# Patient Record
Sex: Female | Born: 1955 | Race: White | Hispanic: No | Marital: Married | Smoking: Never smoker
Health system: Southern US, Community
[De-identification: ages and names within clinical notes are randomized; demographics above are authoritative.]

## PROBLEM LIST (undated history)

## (undated) DIAGNOSIS — C801 Malignant (primary) neoplasm, unspecified: Secondary | ICD-10-CM

## (undated) DIAGNOSIS — M1990 Unspecified osteoarthritis, unspecified site: Secondary | ICD-10-CM

## (undated) DIAGNOSIS — I10 Essential (primary) hypertension: Secondary | ICD-10-CM

## (undated) HISTORY — PX: TONSILLECTOMY: SUR1361

## (undated) HISTORY — PX: TUBAL LIGATION: SHX77

---

## 2001-12-18 ENCOUNTER — Encounter (INDEPENDENT_AMBULATORY_CARE_PROVIDER_SITE_OTHER): Payer: Self-pay

## 2001-12-18 ENCOUNTER — Ambulatory Visit (HOSPITAL_COMMUNITY): Admission: RE | Admit: 2001-12-18 | Discharge: 2001-12-18 | Payer: Self-pay | Admitting: Obstetrics and Gynecology

## 2002-12-24 ENCOUNTER — Emergency Department (HOSPITAL_COMMUNITY): Admission: EM | Admit: 2002-12-24 | Discharge: 2002-12-24 | Payer: Self-pay | Admitting: Emergency Medicine

## 2006-07-02 ENCOUNTER — Encounter: Admission: RE | Admit: 2006-07-02 | Discharge: 2006-07-02 | Payer: Self-pay | Admitting: Obstetrics and Gynecology

## 2010-03-06 ENCOUNTER — Encounter: Payer: Self-pay | Admitting: Obstetrics and Gynecology

## 2010-07-01 NOTE — Op Note (Signed)
   NAME:  Tracy Little, Tracy Little                    ACCOUNT NO.:  [id number]   MEDICAL RECORD NO.:  [id number]                   PATIENT TYPE:  AMB   LOCATION:  SDC                                  FACILITY:  [facility]   PHYSICIAN:  Lenoard Aden, M.D.             DATE OF BIRTH:  12/21/1955   DATE OF PROCEDURE:  12/18/2001  DATE OF DISCHARGE:                                 OPERATIVE REPORT   PREOPERATIVE DIAGNOSES:  1. Menometrorrhagia.  2. Endometrial mass.   POSTOPERATIVE DIAGNOSES:  Endometrial polyp.   PROCEDURE:  1. Diagnostic hysteroscopy.  2. Resectoscopic polypectomy.  3. Dilatation and curettage.   SURGEON:  Lenoard Aden, M.D.   ANESTHESIA:  General.   ESTIMATED BLOOD LOSS:  Less than 50 cc.   FLUID DEFICIT:  40 cc.   COMPLICATIONS:  None.   SPECIMENS:  Endometrial polyp, endometrial curettings.   DISPOSITION:  The patient to recovery in good condition.   BRIEF OPERATIVE NOTE:  After being apprised of the risks of anesthesia,  infection, bleeding, uterine perforation, possible need for repair, injury  to bowel and bladder with possible need for repair, the patient is brought  to the operating room where she is administered general anesthetic without  complications, prepped and draped in usual sterile fashion, catheterized  until the bladder is empty.  Feet are placed in the yellow thin stirrups.  After achieving adequate anesthesia examination reveals a retroflexed bulky  uterus.  No adnexal masses.  Cervix easily dilated up to a number 31 Pratt  dilator after placement of a dilute Pitressin solution 16 cc at 3 and 9  o'clock at the cervicovaginal junction.  Hysteroscope is placed easily.  Visualization reveals a right lateral fundally placed lower uterine body  polyp which projects into the mid portion of the cavity.  This is cauterized  and removed at the base using a double loop after multiple passes down to  the level of the endometrium.  Bilateral tubal  ostia are visualized.  D&C is performed using sharp endometrial curette.  Revisualization reveals  complete removal of the polyp.  No active bleeding.  Instruments are  removed.  Fluid deficit 40 cc noted.  The patient is awakened and  transferred to recovery room in good condition.                                               Lenoard Aden, M.D.    RJT/MEDQ  D:  12/18/2001  T:  12/18/2001  Job:  [id number]   cc:   [facility]

## 2010-07-01 NOTE — H&P (Signed)
   NAMEALONNA, Tracy Little                      ACCOUNT NO.:  [id number]   MEDICAL RECORD NO.:  [id number]                   PATIENT TYPE:   LOCATION:                                       FACILITY:  [facility]   PHYSICIAN:  Lenoard Aden, M.D.             DATE OF BIRTH:   DATE OF ADMISSION:  12/18/2001  DATE OF DISCHARGE:                                HISTORY & PHYSICAL   CHIEF COMPLAINT:  Menometrorrhagia with structural uterine mesh.   HISTORY OF THE PRESENT ILLNESS:  The patient is a 55 year old white female  G6, P3 who presents with irregular uterine bleeding.  Saline sonohystography  consistent with a 2 x 1 cm anterior wall mass consistent with fibroid versus  endometrial polyps.   PAST MEDICAL HISTORY:  1. Remarkable for tubal ligation in 1982.  2. Three vaginal deliveries.   No other medical or surgical hospitalizations.  One miscarriage and two  abortions.  Had spinal meningitis in 1990.   ALLERGIES:  No known drug allergies.   MEDICATIONS:  Takes no medications.   FAMILY HISTORY:  Remarkable for myocardial infarction and lymphoma.   PHYSICAL EXAMINATION:   GENERAL:  On physical exam she is a well-developed, well-nourished white  female in no apparent distress.   HEENT:  Normal.   LUNGS:  Clear.   HEART:  Regular rhythm.   ABDOMEN:  Soft and nontender.   PELVIC:  Uterus is bulky and anteflexed, and no adnexal masses are  appreciated.   IMPRESSION:  Structural endometrial lesion, questionable fibroid versus  polyp, with irregular uterine bleeding.    PLAN:  Proceed with diagnostic hysteroscopy, resectoscope.  Risks of  anesthesia, infection, bleeding and injury to intra-abdominal organs and  need for repair were discussed at length.  Risks and immediate complications  to include bowel or bladder injury were noted.  The patient acknowledges and  wishes to proceed.                                                 Lenoard Aden, M.D.    RJT/MEDQ   D:  12/17/2001  T:  12/17/2001  Job:  [id number]

## 2015-08-09 ENCOUNTER — Emergency Department (HOSPITAL_COMMUNITY): Payer: Managed Care, Other (non HMO)

## 2015-08-09 ENCOUNTER — Other Ambulatory Visit: Payer: Self-pay

## 2015-08-09 ENCOUNTER — Emergency Department (HOSPITAL_COMMUNITY)
Admission: EM | Admit: 2015-08-09 | Discharge: 2015-08-09 | Disposition: A | Discharge status: Home / Self Care | Payer: Managed Care, Other (non HMO) | Attending: Emergency Medicine | Admitting: Emergency Medicine

## 2015-08-09 ENCOUNTER — Encounter (HOSPITAL_COMMUNITY): Payer: Self-pay

## 2015-08-09 DIAGNOSIS — R1013 Epigastric pain: Secondary | ICD-10-CM | POA: Diagnosis not present

## 2015-08-09 DIAGNOSIS — Z79899 Other long term (current) drug therapy: Secondary | ICD-10-CM | POA: Diagnosis not present

## 2015-08-09 DIAGNOSIS — R0789 Other chest pain: Secondary | ICD-10-CM | POA: Diagnosis not present

## 2015-08-09 DIAGNOSIS — R079 Chest pain, unspecified: Secondary | ICD-10-CM | POA: Diagnosis present

## 2015-08-09 LAB — BASIC METABOLIC PANEL
Anion gap: 7 (ref 5–15)
BUN: 20 mg/dL (ref 6–20)
CO2: 26 mmol/L (ref 22–32)
Calcium: 9.4 mg/dL (ref 8.9–10.3)
Chloride: 108 mmol/L (ref 101–111)
Creatinine, Ser: 1.13 mg/dL — ABNORMAL HIGH (ref 0.44–1.00)
GFR calc Af Amer: >60 mL/min (ref >60)
GFR calc non Af Amer: 52 mL/min — ABNORMAL LOW (ref >60)
Glucose, Bld: 147 mg/dL — ABNORMAL HIGH (ref 65–99)
Potassium: 3.7 mmol/L (ref 3.5–5.1)
Sodium: 141 mmol/L (ref 135–145)

## 2015-08-09 LAB — CBC
HCT: 43.7 % (ref 36.0–46.0)
Hemoglobin: 14.2 g/dL (ref 12.0–15.0)
MCH: 28 pg (ref 26.0–34.0)
MCHC: 32.5 g/dL (ref 30.0–36.0)
MCV: 86 fL (ref 78.0–100.0)
Platelets: 261 10*3/uL (ref 150–400)
RBC: 5.08 MIL/uL (ref 3.87–5.11)
RDW: 14.4 % (ref 11.5–15.5)
WBC: 9 10*3/uL (ref 4.0–10.5)

## 2015-08-09 LAB — HEPATIC FUNCTION PANEL
ALT: 113 U/L — ABNORMAL HIGH (ref 14–54)
AST: 166 U/L — ABNORMAL HIGH (ref 15–41)
Albumin: 3.6 g/dL (ref 3.5–5.0)
Alkaline Phosphatase: 117 U/L (ref 38–126)
Bilirubin, Direct: 0.4 mg/dL (ref 0.1–0.5)
Indirect Bilirubin: 0.5 mg/dL (ref 0.3–0.9)
Total Bilirubin: 0.9 mg/dL (ref 0.3–1.2)
Total Protein: 6.9 g/dL (ref 6.5–8.1)

## 2015-08-09 LAB — I-STAT TROPONIN, ED
Troponin i, poc: 0 ng/mL (ref 0.00–0.08)
Troponin i, poc: 0 ng/mL (ref 0.00–0.08)

## 2015-08-09 MED ORDER — ONDANSETRON 4 MG PO TBDP
4.0000 mg | ORAL_TABLET | Freq: Once | ORAL | Status: AC | PRN
  Administered 2015-08-09: 4 mg via ORAL

## 2015-08-09 MED ORDER — OMEPRAZOLE 20 MG PO CPDR: 20.0000 mg | DELAYED_RELEASE_CAPSULE | Freq: Every day | ORAL | Status: DC

## 2015-08-09 MED ORDER — ONDANSETRON 4 MG PO TBDP
ORAL_TABLET | ORAL | Status: AC
  Filled 2015-08-09: qty 1

## 2015-08-09 MED ORDER — OXYCODONE-ACETAMINOPHEN 5-325 MG PO TABS
ORAL_TABLET | ORAL | Status: AC
  Filled 2015-08-09: qty 1

## 2015-08-09 MED ORDER — OXYCODONE-ACETAMINOPHEN 5-325 MG PO TABS
1.0000 | ORAL_TABLET | Freq: Once | ORAL | Status: AC
  Administered 2015-08-09: 1 via ORAL

## 2015-08-09 NOTE — ED Notes (Signed)
Per PT, Pt was coming from work today with complaints of left sided chest pressure radiating to the back. Pt reports radiating to the back with nausea, diaphoresis, and SOB. Pt has no Hx of the same

## 2015-08-09 NOTE — ED Provider Notes (Signed)
Complaint of anterior chest pain left-sided radiating to back onset yesterday approximate 1.5 hours after eating. Had similar episode today. She is presently asymptomatic. Associated symptoms include nausea shortness of breath and sweatiness. Patient has had similar episodes in the past has never sought medical care. She regularly exerts herself doing spin classes without any chest pain. Pain was onset approximate hour after eating yesterday and today. On exam alert no distress lungs clear auscultation heart regular rate and rhythm abdomen nondistended nontender extremities with trace pretibial edema. Heart score equals 3. Patient is amenable to outpatient cardiac workup  Orlie Dakin, MD 08/10/15 1724

## 2015-08-09 NOTE — Discharge Instructions (Signed)
There does not appear to be an emergent cause for your symptoms at this time. Your exam, labs, chest x-ray were all reassuring. Your discomfort may be due to acid reflux. Please take your medication as prescribed as this may help with your discomfort. Follow-up with cardiology for reevaluation. You may also follow-up with your primary care doctor for a possible ultrasound of your right upper quadrant to evaluate for gallbladder disease. Return to ED for new or worsening symptoms.

## 2015-08-09 NOTE — ED Provider Notes (Signed)
CSN: [id number]     Arrival date & time 08/09/15  1324 History   First MD Initiated Contact with Patient 08/09/15 1419     Chief Complaint  Patient presents with  . Chest Pain     (Consider location/radiation/quality/duration/timing/severity/associated sxs/prior Treatment) HPI Tracy Little is a 59 y.o. female who comes in for evaluation of chest pain. Patient reports intermittent chest pain over the past 1 month, worse over the past 2 days after eating bacon sandwiches. She reports sharp pain in her left chest that radiated to her back. Most recently started at 10:00 AM after eating and has slowly resolved. Still feels mild discomfort now. She reports associated nausea, 2 episodes of emesis, diaphoresis and shortness of breath. No abdominal pain, fevers or chills, urinary or bowel symptoms. She denies any exertional component and states that she has been exercising more and does not experience this discomfort. No recent travel, surgeries, hemoptysis, OCP use.  History reviewed. No pertinent past medical history. Past Surgical History  Procedure Laterality Date  . Tubal ligation    . Tonsillectomy     No family history on file. Social History  Substance Use Topics  . Smoking status: Never Smoker   . Smokeless tobacco: Never Used  . Alcohol Use: No   OB History    No data available     Review of Systems A 10 point review of systems was completed and was negative except for pertinent positives and negatives as mentioned in the history of present illness     Allergies  Review of patient's allergies indicates no known allergies.  Home Medications   Prior to Admission medications   Medication Sig Start Date End Date Taking? Authorizing Provider  Multiple Vitamin (MULTIVITAMIN WITH MINERALS) TABS tablet Take 1 tablet by mouth daily.   Yes Historical Provider, MD  omeprazole (PRILOSEC) 20 MG capsule Take 1 capsule (20 mg total) by mouth daily. 08/09/15   Comer Locket,  PA-C   BP 128/79 mmHg  Pulse 73  Temp(Src) 97.6 F (36.4 C) (Oral)  Resp 16  Ht 5\' 8"  (1.727 m)  Wt 92.987 kg  BMI 31.18 kg/m2  SpO2 96%  LMP 08/09/2014 (Approximate) Physical Exam  Constitutional: She is oriented to person, place, and time. She appears well-developed and well-nourished.  Obese white female. No apparent distress  HENT:  Head: Normocephalic and atraumatic.  Mouth/Throat: Oropharynx is clear and moist.  Eyes: Conjunctivae are normal. Pupils are equal, round, and reactive to light. Right eye exhibits no discharge. Left eye exhibits no discharge. No scleral icterus.  Neck: Neck supple.  Cardiovascular: Normal rate, regular rhythm and normal heart sounds.   Pulmonary/Chest: Effort normal and breath sounds normal. No respiratory distress. She has no wheezes. She has no rales.  Abdominal: Soft. There is tenderness.  Discomfort is replicated with palpation of epigastrium. Otherwise nontender, nondistended without rebound or guarding. No pulsatile mass.  Musculoskeletal: Normal range of motion. She exhibits no edema or tenderness.  Neurological: She is alert and oriented to person, place, and time.  Cranial Nerves II-XII grossly intact  Skin: Skin is warm and dry. No rash noted.  Psychiatric: She has a normal mood and affect.  Nursing note and vitals reviewed.   ED Course  Procedures (including critical care time) Labs Review Labs Reviewed  BASIC METABOLIC PANEL - Abnormal; Notable for the following:    Glucose, Bld 147 (*)    Creatinine, Ser 1.13 (*)    GFR calc non Af Amer 52 (*)  All other components within normal limits  HEPATIC FUNCTION PANEL - Abnormal; Notable for the following:    AST 166 (*)    ALT 113 (*)    All other components within normal limits  CBC  I-STAT TROPOININ, ED  Randolm Idol, ED    Imaging Review Dg Chest 2 View  08/09/2015  CLINICAL DATA:  Shortness of breath and chest pain for 2 days EXAM: CHEST  2 VIEW COMPARISON:  None.  FINDINGS: Lungs are clear. Heart size and pulmonary vascularity are normal. No adenopathy. No pneumothorax. No bone lesions. IMPRESSION: No edema or consolidation. Electronically Signed   By: Lowella Grip III M.D.   On: 08/09/2015 14:27   I have personally reviewed and evaluated these images and lab results as part of my medical decision-making.   EKG Interpretation   Date/Time:  Monday August 09 2015 13:35:45 EDT Ventricular Rate:  77 PR Interval:  116 QRS Duration: 82 QT Interval:  380 QTC Calculation: 430 R Axis:   79 Text Interpretation:  Normal sinus rhythm Lateral infarct , age  undetermined Abnormal ECG No old tracing to compare Confirmed by  JACUBOWITZ  MD, SAM [phone]) on 08/09/2015 4:28:43 PM     Filed Vitals:   08/09/15 1730 08/09/15 1745 08/09/15 1800 08/09/15 1815  BP: 121/85 128/77 113/73 128/79  Pulse: 72 71 74 73  Temp:      TempSrc:      Resp: 14 13 15 16   Height:      Weight:      SpO2: 99% 99% 100% 96%    MDM  Patient presents for evaluation of left-sided chest pain radiates to her back, started at 10 AM after eating. Has Experienced in the past after eating. There is no exertional component. Discomfort is replicated with palpation of epigastrium. Heart score 3. Delta troponin negative. Patient is now asymptomatic in emergency department is that she feels much better. Discussed follow-up with cardiology for outpatient echo, which she agrees to. Screening labs are reassuring. She does have increased in AST 166 and ALT 113, she has no right upper quadrant tenderness or other concerning findings. Recommended she follow up with PCP for outpatient right upper quadrant ultrasound for further evaluation of possible biliary etiology. Symptoms overall sound GI in etiology, will initiate PPI. Discussed with Dr. Winfred Leeds, Dr Regenia Skeeter. Final diagnoses:  Chest discomfort  Epigastric discomfort        Comer Locket, PA-C 08/09/15 1850  Sherwood Gambler,  MD 08/10/15 [phone]

## 2015-08-09 NOTE — ED Notes (Signed)
Dr. Winfred Leeds at bedside with patient and family.

## 2015-08-26 ENCOUNTER — Ambulatory Visit: Payer: Managed Care, Other (non HMO) | Admitting: Cardiology

## 2020-10-12 DIAGNOSIS — M79643 Pain in unspecified hand: Secondary | ICD-10-CM | POA: Diagnosis not present

## 2020-10-12 DIAGNOSIS — Z6832 Body mass index (BMI) 32.0-32.9, adult: Secondary | ICD-10-CM | POA: Diagnosis not present

## 2020-11-01 DIAGNOSIS — M79641 Pain in right hand: Secondary | ICD-10-CM | POA: Diagnosis not present

## 2020-11-13 ENCOUNTER — Encounter (HOSPITAL_COMMUNITY): Payer: Self-pay | Admitting: *Deleted

## 2020-11-13 ENCOUNTER — Emergency Department (HOSPITAL_COMMUNITY): Payer: Self-pay

## 2020-11-13 ENCOUNTER — Other Ambulatory Visit: Payer: Self-pay

## 2020-11-13 ENCOUNTER — Emergency Department (HOSPITAL_COMMUNITY)
Admission: EM | Admit: 2020-11-13 | Discharge: 2020-11-14 | Disposition: A | Discharge status: Home / Self Care | Payer: Self-pay | Attending: Emergency Medicine | Admitting: Emergency Medicine

## 2020-11-13 DIAGNOSIS — K8020 Calculus of gallbladder without cholecystitis without obstruction: Secondary | ICD-10-CM | POA: Diagnosis not present

## 2020-11-13 DIAGNOSIS — Z79899 Other long term (current) drug therapy: Secondary | ICD-10-CM | POA: Insufficient documentation

## 2020-11-13 DIAGNOSIS — R0789 Other chest pain: Secondary | ICD-10-CM | POA: Diagnosis not present

## 2020-11-13 LAB — CBC
HCT: 44.8 % (ref 36.0–46.0)
Hemoglobin: 13.9 g/dL (ref 12.0–15.0)
MCH: 28.7 pg (ref 26.0–34.0)
MCHC: 31 g/dL (ref 30.0–36.0)
MCV: 92.4 fL (ref 80.0–100.0)
Platelets: 268 10*3/uL (ref 150–400)
RBC: 4.85 MIL/uL (ref 3.87–5.11)
RDW: 14.7 % (ref 11.5–15.5)
WBC: 9 10*3/uL (ref 4.0–10.5)
nRBC: 0 % (ref 0.0–0.2)

## 2020-11-13 LAB — BASIC METABOLIC PANEL
Anion gap: 9 (ref 5–15)
BUN: 24 mg/dL — ABNORMAL HIGH (ref 8–23)
CO2: 26 mmol/L (ref 22–32)
Calcium: 9.2 mg/dL (ref 8.9–10.3)
Chloride: 105 mmol/L (ref 98–111)
Creatinine, Ser: 0.93 mg/dL (ref 0.44–1.00)
GFR, Estimated: >60 mL/min (ref >60)
Glucose, Bld: 109 mg/dL — ABNORMAL HIGH (ref 70–99)
Potassium: 3.6 mmol/L (ref 3.5–5.1)
Sodium: 140 mmol/L (ref 135–145)

## 2020-11-13 LAB — LIPASE, BLOOD: Lipase: 40 U/L (ref 11–51)

## 2020-11-13 LAB — TROPONIN I (HIGH SENSITIVITY): Troponin I (High Sensitivity): 4 ng/L (ref <18)

## 2020-11-13 MED ORDER — ONDANSETRON 4 MG PO TBDP
8.0000 mg | ORAL_TABLET | Freq: Once | ORAL | Status: AC
  Administered 2020-11-13: 8 mg via ORAL
  Filled 2020-11-13: qty 2

## 2020-11-13 NOTE — ED Triage Notes (Signed)
The pt is c/o chest pain with nausea and vomiting for 3 days  she has not taken any med for the pain due to nausea and vomiting

## 2020-11-14 ENCOUNTER — Encounter (HOSPITAL_COMMUNITY): Payer: Self-pay | Admitting: Emergency Medicine

## 2020-11-14 ENCOUNTER — Emergency Department (HOSPITAL_COMMUNITY): Payer: Self-pay

## 2020-11-14 LAB — HEPATIC FUNCTION PANEL
ALT: 20 U/L (ref 0–44)
AST: 27 U/L (ref 15–41)
Albumin: 3.5 g/dL (ref 3.5–5.0)
Alkaline Phosphatase: 64 U/L (ref 38–126)
Bilirubin, Direct: 0.2 mg/dL (ref 0.0–0.2)
Indirect Bilirubin: 0.4 mg/dL (ref 0.3–0.9)
Total Bilirubin: 0.6 mg/dL (ref 0.3–1.2)
Total Protein: 6.7 g/dL (ref 6.5–8.1)

## 2020-11-14 LAB — TROPONIN I (HIGH SENSITIVITY): Troponin I (High Sensitivity): 4 ng/L (ref <18)

## 2020-11-14 NOTE — ED Notes (Signed)
Patient transported to Ultrasound 

## 2020-11-14 NOTE — ED Provider Notes (Signed)
[facility] EMERGENCY DEPARTMENT Provider Note   CSN: [id number] Arrival date & time: 11/13/20  2027     History Chief Complaint  Patient presents with   Chest Pain    Tracy Little is a 65 y.o. female.  No notable past medical history.  Patient presents to the emergency department with complaints of nausea and vomiting and chest pain over the past 3 days.  Patient states that this started 3 days ago after dinner.  She experienced right upper quadrant and epigastric abdominal pain that radiated up to her shoulders into her back.  She said she had associated nausea and vomiting.  She states that this episode lasted about 2 hours and improved on its own.  She had similar episodes the following 2 nights after eating high-fat foods. Last night it was the worst where it lasted 5 to 6 hours.  She describes the pain as feeling like she was going to die.  She took Aleve which helped with the pain.  Pain feels like pressure.  She denies any fevers, shortness of breath, vision changes, diarrhea, constipation, leg swelling, melena, hemoptysis, or hematemesis.   Chest Pain Associated symptoms: abdominal pain, nausea and vomiting   Associated symptoms: no back pain, no cough, no dizziness, no fever, no headache, no palpitations, no shortness of breath and no weakness       History reviewed. No pertinent past medical history.  There are no problems to display for this patient.   Past Surgical History:  Procedure Laterality Date   TONSILLECTOMY     TUBAL LIGATION       OB History   No obstetric history on file.     History reviewed. No pertinent family history.  Social History   Tobacco Use   Smoking status: Never   Smokeless tobacco: Never  Substance Use Topics   Alcohol use: No   Drug use: No    Home Medications Prior to Admission medications   Medication Sig Start Date End Date Taking? Authorizing Provider  Biotin 2500 MCG CAPS Take 2,500 mcg by  mouth daily.   Yes [provider]  chlorthalidone (HYGROTON) 25 MG tablet Take 25 mg by mouth daily. 10/31/20  Yes [provider]  metoprolol succinate (TOPROL-XL) 50 MG 24 hr tablet Take 25 mg by mouth daily.   Yes [provider]  Multiple Vitamin (MULTIVITAMIN WITH MINERALS) TABS tablet Take 1 tablet by mouth daily.   Yes [provider]  naproxen sodium (ALEVE) 220 MG tablet Take 440 mg by mouth daily as needed (pain).   Yes [provider]  predniSONE (DELTASONE) 10 MG tablet Take 10 mg by mouth. 11/01/20  Yes [provider]  omeprazole (PRILOSEC) 20 MG capsule Take 1 capsule (20 mg total) by mouth daily. Patient not taking: Reported on 11/14/2020 08/09/15   Comer Locket, PA-C    Allergies    Patient has no known allergies.  Review of Systems   Review of Systems  Constitutional:  Negative for chills and fever.  HENT:  Negative for congestion, rhinorrhea and sore throat.   Eyes:  Negative for visual disturbance.  Respiratory:  Negative for cough, chest tightness and shortness of breath.   Cardiovascular:  Positive for chest pain. Negative for palpitations and leg swelling.  Gastrointestinal:  Positive for abdominal pain, nausea and vomiting. Negative for blood in stool, constipation and diarrhea.  Genitourinary:  Negative for dysuria, flank pain and hematuria.  Musculoskeletal:  Negative for back pain.  Skin:  Negative for rash and wound.  Neurological:  Negative for dizziness, syncope, weakness, light-headedness and headaches.  Psychiatric/Behavioral:  Negative for confusion.   All other systems reviewed and are negative.  Physical Exam Updated Vital Signs BP 119/81   Pulse 79   Temp 98.1 F (36.7 C) (Oral)   Resp 17   Ht 5\' 8"  (1.727 m)   Wt 93 kg   LMP 08/09/2014 (Approximate)   SpO2 97%   BMI 31.17 kg/m   Physical Exam Vitals and nursing note reviewed.  Constitutional:      General: She is not in acute  distress.    Appearance: Normal appearance. She is not ill-appearing, toxic-appearing or diaphoretic.  HENT:     Head: Normocephalic and atraumatic.     Nose: No nasal deformity.     Mouth/Throat:     Lips: Pink. No lesions.     Mouth: Mucous membranes are dry. No injury, lacerations, oral lesions or angioedema.     Pharynx: Oropharynx is clear. Uvula midline. No pharyngeal swelling, oropharyngeal exudate, posterior oropharyngeal erythema or uvula swelling.     Comments: Geographic tongue Eyes:     General: Gaze aligned appropriately. No scleral icterus.       Right eye: No discharge.        Left eye: No discharge.     Conjunctiva/sclera: Conjunctivae normal.     Right eye: Right conjunctiva is not injected. No exudate or hemorrhage.    Left eye: Left conjunctiva is not injected. No exudate or hemorrhage. Cardiovascular:     Rate and Rhythm: Normal rate and regular rhythm.     Pulses: Normal pulses.          Radial pulses are 2+ on the right side and 2+ on the left side.       Dorsalis pedis pulses are 2+ on the right side and 2+ on the left side.     Heart sounds: Normal heart sounds, S1 normal and S2 normal. Heart sounds not distant. No murmur heard.   No friction rub. No gallop. No S3 or S4 sounds.  Pulmonary:     Effort: Pulmonary effort is normal. No accessory muscle usage or respiratory distress.     Breath sounds: Normal breath sounds. No stridor. No wheezing, rhonchi or rales.  Chest:     Chest wall: No tenderness.  Abdominal:     General: Abdomen is flat. Bowel sounds are normal. There is no distension.     Palpations: Abdomen is soft. There is no mass or pulsatile mass.     Tenderness: There is abdominal tenderness in the right upper quadrant and epigastric area. There is no guarding or rebound.  Musculoskeletal:     Right lower leg: No edema.     Left lower leg: No edema.  Skin:    General: Skin is warm and dry.     Coloration: Skin is not jaundiced or pale.      Findings: No bruising, erythema, lesion or rash.  Neurological:     General: No focal deficit present.     Mental Status: She is alert and oriented to person, place, and time.     GCS: GCS eye subscore is 4. GCS verbal subscore is 5. GCS motor subscore is 6.  Psychiatric:        Mood and Affect: Mood normal.        Behavior: Behavior normal. Behavior is cooperative.    ED Results / Procedures / Treatments  Labs (all labs ordered are listed, but only abnormal results are displayed) Labs Reviewed  BASIC METABOLIC PANEL - Abnormal; Notable for the following components:      Result Value   Glucose, Bld 109 (*)    BUN 24 (*)    All other components within normal limits  CBC  LIPASE, BLOOD  HEPATIC FUNCTION PANEL  TROPONIN I (HIGH SENSITIVITY)  TROPONIN I (HIGH SENSITIVITY)    EKG EKG Interpretation  Date/Time:  Saturday November 13 2020 20:35:32 EDT Ventricular Rate:  68 PR Interval:  166 QRS Duration: 82 QT Interval:  402 QTC Calculation: 427 R Axis:   88 Text Interpretation: Normal sinus rhythm Cannot rule out Anterior infarct , age undetermined Abnormal ECG no STEMI. no acute ischemic changes compared to previous tracing Confirmed by Charlesetta Shanks [phone]) on 11/14/2020 7:34:14 AM  Radiology DG Chest 2 View  Result Date: 11/13/2020 CLINICAL DATA:  Chest pain with nausea and vomiting for several days EXAM: CHEST - 2 VIEW COMPARISON:  08/09/2015 FINDINGS: The heart size and mediastinal contours are within normal limits. Both lungs are clear. Minimal scarring is noted in the left base. The visualized skeletal structures are unremarkable. IMPRESSION: No active cardiopulmonary disease. Electronically Signed   By: Inez Catalina M.D.   On: 11/13/2020 21:29   US Abdomen Complete  Result Date: 11/14/2020 CLINICAL DATA:  look at gallbladder, liver, appendix, aorta EXAM: ABDOMEN ULTRASOUND COMPLETE COMPARISON:  None. FINDINGS: Gallbladder: Cholelithiasis. The gallbladder is slightly  distended measuring up to 9.5 x 4.7 cm. Borderline wall thickening measuring 3-4 mm. No pericholecystic fluid. Negative sonographic Murphy's sign. Common bile duct: Diameter: 0.5 cm Liver: No focal lesion identified. Within normal limits in parenchymal echogenicity. Portal vein is patent on color Doppler imaging with normal direction of blood flow towards the liver. IVC: Not well visualized. Pancreas: Visualized portion unremarkable. Spleen: Size and appearance within normal limits. Right Kidney: Length: 10.1 cm. Echogenicity within normal limits. No mass or hydronephrosis visualized. Left Kidney: Length: 10.7 cm. There is an ill-defined mass in the midpole measuring up to 2.4 x 1.8 x 1.7 cm. Abdominal aorta: No aneurysm visualized. Other findings: None. IMPRESSION: 1. Cholelithiasis with distended gallbladder and borderline wall thickening. No pericholecystic fluid, negative sonographic Murphy's sign. Overall, these findings are equivocal for acute cholecystitis. If persistent clinical concern, recommend HIDA scan for further evaluation. 2. There is an ill-defined mass in the left kidney midpole measuring up to 2.4 cm. Further evaluation with either CT or MRI renal mass protocol is recommended. 3. The appendix is not visualized on this exam. Electronically Signed   By: Albin Felling M.D.   On: 11/14/2020 10:17    Procedures Procedures   Medications Ordered in ED Medications  ondansetron (ZOFRAN-ODT) disintegrating tablet 8 mg (8 mg Oral Given 11/13/20 2100)    ED Course  I have reviewed the triage vital signs and the nursing notes.  Pertinent labs & imaging results that were available during my care of the patient were reviewed by me and considered in my medical decision making (see chart for details).  Clinical Course as of 11/14/20 1551  Sun Nov 14, 2020  1328 Spoke with General Surgery, Rosendo Gros regarding cholelithiasis. They will schedule her an appointment this week and see her in the office  [GL]    Clinical Course User Index [GL] Deaysia Grigoryan, Adora Fridge, PA-C   MDM Rules/Calculators/A&P  This is a well-appearing 65 year old female presents the emergency department with epigastric abdominal pain that is present mostly after eating fatty foods.  Her vitals on arrival are normal.  She is afebrile. Patient in no acute distress. She is nontoxic. Walking around room. No active abdominal pain.  I suspect that patient has a gallbladder etiology to her symptoms. Obtaining abdominal ultrasound to r/o gallbladder disease.  I reviewed All labs and imaging. She has no leukocytosis.  Her LFTs are not elevated.  She has no bilirubin elevation.  Her lipase is normal.  Her troponin is normal.  Other labs are insignificant. Chest x-ray with no acute cardiopulmonary disease. EKG with no change from prior. Ultrasound shows cholelithiasis with distended gallbladder and borderline wall thickening.  This shows an ill-defined mass in the left kidney midpole measuring up to 2.4 cm.  Further evaluation with CT or MRI is recommended outpatient.  Given patient's presentation and lack of systemic symptoms including fever, jaundice, leukocytosis, continued pain I do not Hise high suspicion for cholecystitis at this time.  She does have cholelithiasis and needs to follow-up with general surgery.  1328: I spoke with general surgery Dr. Rosendo Gros regarding cholelithiasis.  They agree the patient does not meet inpatient requirements.  They will see her in the office later this week.  Patient is given contact instructions.  Patient given discharge instructions and she feels comfortable with the plan going forward.  Told to return to the emergency department if she develops worsening symptoms.  Patient needs to follow-up with her PCP regarding the left kidney mass found incidentally.  She will need outpatient CT or MRI to further characterize this.  Final Clinical Impression(s) / ED Diagnoses Final  diagnoses:  Calculus of gallbladder without cholecystitis without obstruction    Rx / DC Orders ED Discharge Orders          Ordered    Ambulatory referral to General Surgery        11/14/20 1347             Sheila Oats 11/14/20 1551    Charlesetta Shanks, MD 11/29/20 [phone]

## 2020-11-14 NOTE — Discharge Instructions (Addendum)
You have been seen in the emergency department for epigastric abdominal pain.  We found gallstones in your gallbladder.  You need to call Dr. Rosendo Gros from general surgery tomorrow morning to schedule an appointment for later this week.  Please follow a low-fat diet with limitation of spicy foods.  I provided a informational pamphlet in your discharge instructions for further information.  You develop symptoms such as fever, chills, ongoing symptoms that do not go away on their own, yellow tinted skin please return to the emergency department.

## 2020-11-29 DIAGNOSIS — N2889 Other specified disorders of kidney and ureter: Secondary | ICD-10-CM | POA: Diagnosis not present

## 2020-11-29 DIAGNOSIS — K8020 Calculus of gallbladder without cholecystitis without obstruction: Secondary | ICD-10-CM | POA: Diagnosis not present

## 2020-12-06 ENCOUNTER — Other Ambulatory Visit: Payer: Self-pay | Admitting: Surgery

## 2020-12-06 DIAGNOSIS — N2889 Other specified disorders of kidney and ureter: Secondary | ICD-10-CM

## 2020-12-07 ENCOUNTER — Ambulatory Visit
Admission: RE | Admit: 2020-12-07 | Discharge: 2020-12-07 | Disposition: A | Discharge status: Home / Self Care | Payer: BC Managed Care – PPO | Source: Ambulatory Visit | Attending: Surgery | Admitting: Surgery

## 2020-12-07 ENCOUNTER — Other Ambulatory Visit: Payer: Self-pay

## 2020-12-07 DIAGNOSIS — K8080 Other cholelithiasis without obstruction: Secondary | ICD-10-CM | POA: Diagnosis not present

## 2020-12-07 DIAGNOSIS — N2889 Other specified disorders of kidney and ureter: Secondary | ICD-10-CM

## 2020-12-07 MED ORDER — GADOBENATE DIMEGLUMINE 529 MG/ML IV SOLN
19.0000 mL | Freq: Once | INTRAVENOUS | Status: AC | PRN
  Administered 2020-12-07: 19 mL via INTRAVENOUS

## 2020-12-14 ENCOUNTER — Ambulatory Visit: Payer: Self-pay | Admitting: Surgery

## 2020-12-14 DIAGNOSIS — N3020 Other chronic cystitis without hematuria: Secondary | ICD-10-CM | POA: Diagnosis not present

## 2020-12-14 DIAGNOSIS — R3121 Asymptomatic microscopic hematuria: Secondary | ICD-10-CM | POA: Diagnosis not present

## 2020-12-14 DIAGNOSIS — K8050 Calculus of bile duct without cholangitis or cholecystitis without obstruction: Secondary | ICD-10-CM | POA: Diagnosis not present

## 2020-12-15 NOTE — Patient Instructions (Signed)
DUE TO COVID-19 ONLY ONE VISITOR IS ALLOWED TO COME WITH YOU AND STAY IN THE WAITING ROOM ONLY DURING PRE OP AND PROCEDURE.   **NO VISITORS ARE ALLOWED IN THE SHORT STAY AREA OR RECOVERY ROOM!!**  IF YOU WILL BE ADMITTED INTO THE HOSPITAL YOU ARE ALLOWED ONLY TWO SUPPORT PEOPLE DURING VISITATION HOURS ONLY (7 AM -8PM)   The support person(s) must pass our screening, gel in and out, and wear a mask at all times, including in the patient's room. Patients must also wear a mask when staff or their support person are in the room. Visitors GUEST BADGE MUST BE WORN VISIBLY  One adult visitor may remain with you overnight and MUST be in the room by 8 P.M.  No visitors under the age of 97. Any visitor under the age of 25 must be accompanied by an adult.     Your procedure is scheduled on: 12/23/20   Report to [facility] Main Entrance    Report to admitting at : 10:15 AM   Call this number if you have problems the morning of surgery [phone]   Do not eat food :After Midnight.   May have liquids until : 9:30 AM   day of surgery  CLEAR LIQUID DIET  Foods Allowed                                                                     Foods Excluded  Water, Black Coffee and tea, regular and decaf                             liquids that you cannot  Plain Jell-O in any flavor  (No red)                                           see through such as: Fruit ices (not with fruit pulp)                                     milk, soups, orange juice              Iced Popsicles (No red)                                    All solid food                                   Apple juices Sports drinks like Gatorade (No red) Lightly seasoned clear broth or consume(fat free) Sugar  Sample Menu Breakfast                                Lunch  Supper Cranberry juice                    Beef broth                            Chicken broth Jell-O                                      Grape juice                           Apple juice Coffee or tea                        Jell-O                                      Popsicle                                                Coffee or tea                        Coffee or tea     Oral Hygiene is also important to reduce your risk of infection.                                    Remember - BRUSH YOUR TEETH THE MORNING OF SURGERY WITH YOUR REGULAR TOOTHPASTE   Do NOT smoke after Midnight   Take these medicines the morning of surgery with A SIP OF WATER: N/A  DO NOT TAKE ANY ORAL DIABETIC MEDICATIONS DAY OF YOUR SURGERY                              You may not have any metal on your body including hair pins, jewelry, and body piercing             Do not wear make-up, lotions, powders, perfumes/cologne, or deodorant  Do not wear nail polish including gel and S&S, artificial/acrylic nails, or any other type of covering on natural nails including finger and toenails. If you have artificial nails, gel coating, etc. that needs to be removed by a nail salon please have this removed prior to surgery or surgery may need to be canceled/ delayed if the surgeon/ anesthesia feels like they are unable to be safely monitored.   Do not shave  48 hours prior to surgery.    Do not bring valuables to the hospital. [place].   Contacts, dentures or bridgework may not be worn into surgery.   Bring small overnight bag day of surgery.    Patients discharged on the day of surgery will not be allowed to drive home.   Special Instructions: Bring a copy of your healthcare power of attorney and living will documents  the day of surgery if you haven't scanned them before.              Please read over the following fact sheets you were given: IF YOU HAVE QUESTIONS ABOUT YOUR PRE-OP INSTRUCTIONS PLEASE CALL [phone]     [facility] - Preparing for Surgery Before surgery, you can  play an important role.  Because skin is not sterile, your skin needs to be as free of germs as possible.  You can reduce the number of germs on your skin by washing with CHG (chlorahexidine gluconate) soap before surgery.  CHG is an antiseptic cleaner which kills germs and bonds with the skin to continue killing germs even after washing. Please DO NOT use if you have an allergy to CHG or antibacterial soaps.  If your skin becomes reddened/irritated stop using the CHG and inform your nurse when you arrive at Short Stay. Do not shave (including legs and underarms) for at least 48 hours prior to the first CHG shower.  You may shave your face/neck. Please follow these instructions carefully:  1.  Shower with CHG Soap the night before surgery and the  morning of Surgery.  2.  If you choose to wash your hair, wash your hair first as usual with your  normal  shampoo.  3.  After you shampoo, rinse your hair and body thoroughly to remove the  shampoo.                           4.  Use CHG as you would any other liquid soap.  You can apply chg directly  to the skin and wash                       Gently with a scrungie or clean washcloth.  5.  Apply the CHG Soap to your body ONLY FROM THE NECK DOWN.   Do not use on face/ open                           Wound or open sores. Avoid contact with eyes, ears mouth and genitals (private parts).                       Wash face,  Genitals (private parts) with your normal soap.             6.  Wash thoroughly, paying special attention to the area where your surgery  will be performed.  7.  Thoroughly rinse your body with warm water from the neck down.  8.  DO NOT shower/wash with your normal soap after using and rinsing off  the CHG Soap.                9.  Pat yourself dry with a clean towel.            10.  Wear clean pajamas.            11.  Place clean sheets on your bed the night of your first shower and do not  sleep with pets. Day of Surgery : Do not apply any  lotions/deodorants the morning of surgery.  Please wear clean clothes to the hospital/surgery center.  FAILURE TO FOLLOW THESE INSTRUCTIONS MAY RESULT IN THE CANCELLATION OF YOUR SURGERY PATIENT SIGNATURE_________________________________  NURSE SIGNATURE__________________________________  ________________________________________________________________________

## 2020-12-16 ENCOUNTER — Encounter (HOSPITAL_COMMUNITY)
Admission: RE | Admit: 2020-12-16 | Discharge: 2020-12-16 | Disposition: A | Discharge status: Home / Self Care | Payer: BC Managed Care – PPO | Source: Ambulatory Visit | Attending: Surgery | Admitting: Surgery

## 2020-12-16 ENCOUNTER — Encounter (HOSPITAL_COMMUNITY): Payer: Self-pay

## 2020-12-16 ENCOUNTER — Other Ambulatory Visit: Payer: Self-pay

## 2020-12-16 VITALS — BP 138/82 | HR 90 | Temp 97.6°F | Resp 18 | Ht 67.0 in | Wt 213.5 lb

## 2020-12-16 DIAGNOSIS — Z01818 Encounter for other preprocedural examination: Secondary | ICD-10-CM

## 2020-12-16 DIAGNOSIS — Z01812 Encounter for preprocedural laboratory examination: Secondary | ICD-10-CM | POA: Diagnosis not present

## 2020-12-16 HISTORY — DX: Malignant (primary) neoplasm, unspecified: C80.1

## 2020-12-16 HISTORY — DX: Unspecified osteoarthritis, unspecified site: M19.90

## 2020-12-16 LAB — CBC
HCT: 45 % (ref 36.0–46.0)
Hemoglobin: 14.3 g/dL (ref 12.0–15.0)
MCH: 28.6 pg (ref 26.0–34.0)
MCHC: 31.8 g/dL (ref 30.0–36.0)
MCV: 90 fL (ref 80.0–100.0)
Platelets: 269 10*3/uL (ref 150–400)
RBC: 5 MIL/uL (ref 3.87–5.11)
RDW: 14.6 % (ref 11.5–15.5)
WBC: 5.9 10*3/uL (ref 4.0–10.5)
nRBC: 0 % (ref 0.0–0.2)

## 2020-12-16 NOTE — Progress Notes (Signed)
COVID Vaccine Completed: Yes Date COVID Vaccine completed: 2022 x 4 COVID vaccine manufacturer:  Moderna    COVID Test: N/A  PCP - Dr. Stoney Bang Cardiologist -   Chest x-ray -  EKG - 11/19/20 Stress Test -  ECHO -  Cardiac Cath -  Pacemaker/ICD device last checked:  Sleep Study -  CPAP -   Fasting Blood Sugar -  Checks Blood Sugar _____ times a day  Blood Thinner Instructions: Aspirin Instructions: Last Dose:  Anesthesia review:   Patient denies shortness of breath, fever, cough and chest pain at PAT appointment   Patient verbalized understanding of instructions that were given to them at the PAT appointment. Patient was also instructed that they will need to review over the PAT instructions again at home before surgery.

## 2020-12-21 DIAGNOSIS — R3121 Asymptomatic microscopic hematuria: Secondary | ICD-10-CM | POA: Diagnosis not present

## 2020-12-23 ENCOUNTER — Ambulatory Visit (HOSPITAL_COMMUNITY): Payer: BC Managed Care – PPO | Admitting: Certified Registered Nurse Anesthetist

## 2020-12-23 ENCOUNTER — Ambulatory Visit (HOSPITAL_COMMUNITY): Payer: BC Managed Care – PPO

## 2020-12-23 ENCOUNTER — Encounter (HOSPITAL_COMMUNITY): Payer: Self-pay | Admitting: Surgery

## 2020-12-23 ENCOUNTER — Encounter (HOSPITAL_COMMUNITY)
Admission: RE | Disposition: A | Discharge status: Home / Self Care | Payer: Self-pay | Source: Home / Self Care | Attending: Surgery

## 2020-12-23 ENCOUNTER — Inpatient Hospital Stay (HOSPITAL_COMMUNITY)
Admission: RE | Admit: 2020-12-23 | Discharge: 2020-12-25 | DRG: 419 | Disposition: A | Discharge status: Home / Self Care | Payer: BC Managed Care – PPO | Attending: Surgery | Admitting: Surgery

## 2020-12-23 ENCOUNTER — Other Ambulatory Visit: Payer: Self-pay

## 2020-12-23 DIAGNOSIS — K8010 Calculus of gallbladder with chronic cholecystitis without obstruction: Secondary | ICD-10-CM | POA: Diagnosis not present

## 2020-12-23 DIAGNOSIS — K8020 Calculus of gallbladder without cholecystitis without obstruction: Secondary | ICD-10-CM | POA: Diagnosis not present

## 2020-12-23 DIAGNOSIS — M1990 Unspecified osteoarthritis, unspecified site: Secondary | ICD-10-CM | POA: Diagnosis not present

## 2020-12-23 DIAGNOSIS — N2889 Other specified disorders of kidney and ureter: Secondary | ICD-10-CM | POA: Diagnosis present

## 2020-12-23 DIAGNOSIS — K8050 Calculus of bile duct without cholangitis or cholecystitis without obstruction: Secondary | ICD-10-CM | POA: Diagnosis not present

## 2020-12-23 DIAGNOSIS — Z9049 Acquired absence of other specified parts of digestive tract: Secondary | ICD-10-CM | POA: Diagnosis not present

## 2020-12-23 DIAGNOSIS — K8064 Calculus of gallbladder and bile duct with chronic cholecystitis without obstruction: Secondary | ICD-10-CM | POA: Diagnosis not present

## 2020-12-23 DIAGNOSIS — Z419 Encounter for procedure for purposes other than remedying health state, unspecified: Secondary | ICD-10-CM

## 2020-12-23 DIAGNOSIS — R932 Abnormal findings on diagnostic imaging of liver and biliary tract: Secondary | ICD-10-CM | POA: Diagnosis not present

## 2020-12-23 HISTORY — PX: CHOLECYSTECTOMY: SHX55

## 2020-12-23 LAB — COMPREHENSIVE METABOLIC PANEL
ALT: 54 U/L — ABNORMAL HIGH (ref 0–44)
AST: 80 U/L — ABNORMAL HIGH (ref 15–41)
Albumin: 3.7 g/dL (ref 3.5–5.0)
Alkaline Phosphatase: 67 U/L (ref 38–126)
Anion gap: 9 (ref 5–15)
BUN: 18 mg/dL (ref 8–23)
CO2: 23 mmol/L (ref 22–32)
Calcium: 8.4 mg/dL — ABNORMAL LOW (ref 8.9–10.3)
Chloride: 107 mmol/L (ref 98–111)
Creatinine, Ser: 0.82 mg/dL (ref 0.44–1.00)
GFR, Estimated: >60 mL/min (ref >60)
Glucose, Bld: 159 mg/dL — ABNORMAL HIGH (ref 70–99)
Potassium: 3 mmol/L — ABNORMAL LOW (ref 3.5–5.1)
Sodium: 139 mmol/L (ref 135–145)
Total Bilirubin: 0.7 mg/dL (ref 0.3–1.2)
Total Protein: 6.7 g/dL (ref 6.5–8.1)

## 2020-12-23 SURGERY — LAPAROSCOPIC CHOLECYSTECTOMY WITH INTRAOPERATIVE CHOLANGIOGRAM
Anesthesia: General | Site: Abdomen

## 2020-12-23 MED ORDER — ONDANSETRON HCL 4 MG/2ML IJ SOLN
INTRAMUSCULAR | Status: DC | PRN
  Administered 2020-12-23: 4 mg via INTRAVENOUS

## 2020-12-23 MED ORDER — MIDAZOLAM HCL 5 MG/5ML IJ SOLN
INTRAMUSCULAR | Status: DC | PRN
  Administered 2020-12-23: 2 mg via INTRAVENOUS

## 2020-12-23 MED ORDER — PHENYLEPHRINE 40 MCG/ML (10ML) SYRINGE FOR IV PUSH (FOR BLOOD PRESSURE SUPPORT)
PREFILLED_SYRINGE | INTRAVENOUS | Status: DC | PRN
  Administered 2020-12-23: 80 ug via INTRAVENOUS

## 2020-12-23 MED ORDER — LACTATED RINGERS IV SOLN: INTRAVENOUS | Status: DC

## 2020-12-23 MED ORDER — 0.9 % SODIUM CHLORIDE (POUR BTL) OPTIME
TOPICAL | Status: DC | PRN
  Administered 2020-12-23: 1000 mL

## 2020-12-23 MED ORDER — OXYCODONE HCL 5 MG PO TABS
5.0000 mg | ORAL_TABLET | ORAL | Status: DC | PRN
  Administered 2020-12-23 – 2020-12-24 (×4): 5 mg via ORAL
  Filled 2020-12-23 (×4): qty 1

## 2020-12-23 MED ORDER — DEXAMETHASONE SODIUM PHOSPHATE 10 MG/ML IJ SOLN
INTRAMUSCULAR | Status: DC | PRN
  Administered 2020-12-23: 6 mg via INTRAVENOUS

## 2020-12-23 MED ORDER — FENTANYL CITRATE (PF) 100 MCG/2ML IJ SOLN
INTRAMUSCULAR | Status: AC
  Filled 2020-12-23: qty 2

## 2020-12-23 MED ORDER — DOCUSATE SODIUM 100 MG PO CAPS
100.0000 mg | ORAL_CAPSULE | Freq: Two times a day (BID) | ORAL | Status: DC
  Administered 2020-12-23 – 2020-12-25 (×4): 100 mg via ORAL
  Filled 2020-12-23 (×4): qty 1

## 2020-12-23 MED ORDER — EPHEDRINE SULFATE-NACL 50-0.9 MG/10ML-% IV SOSY
PREFILLED_SYRINGE | INTRAVENOUS | Status: DC | PRN
  Administered 2020-12-23 (×2): 7.5 mg via INTRAVENOUS

## 2020-12-23 MED ORDER — CEFAZOLIN SODIUM-DEXTROSE 2-4 GM/100ML-% IV SOLN
2.0000 g | INTRAVENOUS | Status: AC
  Administered 2020-12-23: 2 g via INTRAVENOUS
  Filled 2020-12-23: qty 100

## 2020-12-23 MED ORDER — DEXAMETHASONE SODIUM PHOSPHATE 10 MG/ML IJ SOLN
INTRAMUSCULAR | Status: AC
  Filled 2020-12-23: qty 1

## 2020-12-23 MED ORDER — ROCURONIUM BROMIDE 10 MG/ML (PF) SYRINGE
PREFILLED_SYRINGE | INTRAVENOUS | Status: AC
  Filled 2020-12-23: qty 10

## 2020-12-23 MED ORDER — BUPIVACAINE-EPINEPHRINE 0.25% -1:200000 IJ SOLN
INTRAMUSCULAR | Status: DC | PRN
  Administered 2020-12-23: 30 mL

## 2020-12-23 MED ORDER — LACTATED RINGERS IV SOLN
INTRAVENOUS | Status: AC | PRN
  Administered 2020-12-23: 1000 mL

## 2020-12-23 MED ORDER — ORAL CARE MOUTH RINSE: 15.0000 mL | Freq: Once | OROMUCOSAL | Status: AC

## 2020-12-23 MED ORDER — SUGAMMADEX SODIUM 200 MG/2ML IV SOLN
INTRAVENOUS | Status: DC | PRN
  Administered 2020-12-23: 300 mg via INTRAVENOUS

## 2020-12-23 MED ORDER — KETAMINE HCL 10 MG/ML IJ SOLN
INTRAMUSCULAR | Status: DC | PRN
  Administered 2020-12-23: 30 mg via INTRAVENOUS

## 2020-12-23 MED ORDER — GABAPENTIN 300 MG PO CAPS
300.0000 mg | ORAL_CAPSULE | ORAL | Status: AC
  Administered 2020-12-23: 300 mg via ORAL
  Filled 2020-12-23: qty 1

## 2020-12-23 MED ORDER — FENTANYL CITRATE (PF) 100 MCG/2ML IJ SOLN
INTRAMUSCULAR | Status: DC | PRN
  Administered 2020-12-23 (×4): 50 ug via INTRAVENOUS

## 2020-12-23 MED ORDER — ACETAMINOPHEN 500 MG PO TABS
1000.0000 mg | ORAL_TABLET | Freq: Three times a day (TID) | ORAL | Status: DC
  Administered 2020-12-23 – 2020-12-25 (×6): 1000 mg via ORAL
  Filled 2020-12-23 (×6): qty 2

## 2020-12-23 MED ORDER — ONDANSETRON 4 MG PO TBDP: 4.0000 mg | ORAL_TABLET | Freq: Four times a day (QID) | ORAL | Status: DC | PRN

## 2020-12-23 MED ORDER — LIDOCAINE HCL (PF) 2 % IJ SOLN
INTRAMUSCULAR | Status: AC
  Filled 2020-12-23: qty 10

## 2020-12-23 MED ORDER — SODIUM CHLORIDE (PF) 0.9 % IJ SOLN
INTRAMUSCULAR | Status: DC | PRN
  Administered 2020-12-23: 20 mL

## 2020-12-23 MED ORDER — BUPIVACAINE-EPINEPHRINE (PF) 0.25% -1:200000 IJ SOLN
INTRAMUSCULAR | Status: AC
  Filled 2020-12-23: qty 30

## 2020-12-23 MED ORDER — LIDOCAINE 20MG/ML (2%) 15 ML SYRINGE OPTIME
INTRAMUSCULAR | Status: DC | PRN
  Administered 2020-12-23: 1.5 mg/kg/h via INTRAVENOUS

## 2020-12-23 MED ORDER — ROCURONIUM BROMIDE 10 MG/ML (PF) SYRINGE
PREFILLED_SYRINGE | INTRAVENOUS | Status: DC | PRN
  Administered 2020-12-23: 50 mg via INTRAVENOUS

## 2020-12-23 MED ORDER — ACETAMINOPHEN 500 MG PO TABS
1000.0000 mg | ORAL_TABLET | ORAL | Status: AC
  Administered 2020-12-23: 1000 mg via ORAL
  Filled 2020-12-23: qty 2

## 2020-12-23 MED ORDER — HYDROMORPHONE HCL 1 MG/ML IJ SOLN
0.2500 mg | INTRAMUSCULAR | Status: DC | PRN
Start: 2020-12-23 — End: 2020-12-23

## 2020-12-23 MED ORDER — CHLORHEXIDINE GLUCONATE 0.12 % MT SOLN
15.0000 mL | Freq: Once | OROMUCOSAL | Status: AC
  Administered 2020-12-23: 15 mL via OROMUCOSAL

## 2020-12-23 MED ORDER — MIDAZOLAM HCL 2 MG/2ML IJ SOLN
INTRAMUSCULAR | Status: AC
  Filled 2020-12-23: qty 2

## 2020-12-23 MED ORDER — LIDOCAINE 2% (20 MG/ML) 5 ML SYRINGE
INTRAMUSCULAR | Status: DC | PRN
  Administered 2020-12-23: 100 mg via INTRAVENOUS

## 2020-12-23 MED ORDER — POTASSIUM CHLORIDE CRYS ER 20 MEQ PO TBCR
40.0000 meq | EXTENDED_RELEASE_TABLET | Freq: Two times a day (BID) | ORAL | Status: AC
  Administered 2020-12-23 (×2): 40 meq via ORAL
  Filled 2020-12-23 (×2): qty 2

## 2020-12-23 MED ORDER — PROPOFOL 10 MG/ML IV BOLUS
INTRAVENOUS | Status: DC | PRN
  Administered 2020-12-23: 150 mg via INTRAVENOUS

## 2020-12-23 MED ORDER — ONDANSETRON HCL 4 MG/2ML IJ SOLN: 4.0000 mg | Freq: Four times a day (QID) | INTRAMUSCULAR | Status: DC | PRN

## 2020-12-23 MED ORDER — PROPOFOL 10 MG/ML IV BOLUS
INTRAVENOUS | Status: AC
  Filled 2020-12-23: qty 20

## 2020-12-23 MED ORDER — ONDANSETRON HCL 4 MG/2ML IJ SOLN
INTRAMUSCULAR | Status: AC
  Filled 2020-12-23: qty 2

## 2020-12-23 SURGICAL SUPPLY — 41 items
APPLIER CLIP 5 13 M/L LIGAMAX5 (MISCELLANEOUS) ×2
BAG COUNTER SPONGE SURGICOUNT (BAG) ×2 IMPLANT
CHLORAPREP W/TINT 26 (MISCELLANEOUS) ×2 IMPLANT
CLIP APPLIE 5 13 M/L LIGAMAX5 (MISCELLANEOUS) ×1 IMPLANT
COVER SURGICAL LIGHT HANDLE (MISCELLANEOUS) ×2 IMPLANT
DECANTER SPIKE VIAL GLASS SM (MISCELLANEOUS) ×2 IMPLANT
DERMABOND ADVANCED (GAUZE/BANDAGES/DRESSINGS) ×1
DERMABOND ADVANCED .7 DNX12 (GAUZE/BANDAGES/DRESSINGS) ×1 IMPLANT
DRAPE C-ARM 42X120 X-RAY (DRAPES) ×2 IMPLANT
DRAPE SHEET LG 3/4 BI-LAMINATE (DRAPES) ×2 IMPLANT
ELECT L-HOOK LAP 45CM DISP (ELECTROSURGICAL)
ELECT PENCIL ROCKER SW 15FT (MISCELLANEOUS) ×2 IMPLANT
ELECT REM PT RETURN 15FT ADLT (MISCELLANEOUS) ×2 IMPLANT
ELECTRODE L-HOOK LAP 45CM DISP (ELECTROSURGICAL) IMPLANT
ENDOLOOP SUT PDS II  0 18 (SUTURE) ×4
ENDOLOOP SUT PDS II 0 18 (SUTURE) ×2 IMPLANT
GLOVE SURG POLYISO LF SZ5.5 (GLOVE) ×2 IMPLANT
GLOVE SURG UNDER POLY LF SZ6 (GLOVE) ×2 IMPLANT
GOWN STRL REUS W/TWL LRG LVL3 (GOWN DISPOSABLE) ×2 IMPLANT
GOWN STRL REUS W/TWL XL LVL3 (GOWN DISPOSABLE) ×4 IMPLANT
GRASPER SUT TROCAR 14GX15 (MISCELLANEOUS) ×2 IMPLANT
HEMOSTAT SNOW SURGICEL 2X4 (HEMOSTASIS) IMPLANT
IRRIG SUCT STRYKERFLOW 2 WTIP (MISCELLANEOUS) ×2
IRRIGATION SUCT STRKRFLW 2 WTP (MISCELLANEOUS) ×1 IMPLANT
KIT BASIN OR (CUSTOM PROCEDURE TRAY) ×2 IMPLANT
KIT TURNOVER KIT A (KITS) IMPLANT
L-HOOK LAP DISP 36CM (ELECTROSURGICAL) ×2
LHOOK LAP DISP 36CM (ELECTROSURGICAL) ×1 IMPLANT
NEEDLE INSUFFLATION 14GA 120MM (NEEDLE) IMPLANT
POUCH SPECIMEN RETRIEVAL 10MM (ENDOMECHANICALS) ×2 IMPLANT
SCISSORS LAP 5X35 DISP (ENDOMECHANICALS) ×2 IMPLANT
SET CHOLANGIOGRAPH MIX (MISCELLANEOUS) ×2 IMPLANT
SET TUBE SMOKE EVAC HIGH FLOW (TUBING) ×2 IMPLANT
SLEEVE XCEL OPT CAN 5 100 (ENDOMECHANICALS) ×4 IMPLANT
SUT MNCRL AB 4-0 PS2 18 (SUTURE) ×2 IMPLANT
TOWEL OR 17X26 10 PK STRL BLUE (TOWEL DISPOSABLE) ×2 IMPLANT
TOWEL OR NON WOVEN STRL DISP B (DISPOSABLE) IMPLANT
TRAY LAPAROSCOPIC (CUSTOM PROCEDURE TRAY) ×2 IMPLANT
TROCAR BLADELESS OPT 5 100 (ENDOMECHANICALS) ×2 IMPLANT
TROCAR XCEL 12X100 BLDLESS (ENDOMECHANICALS) ×2 IMPLANT
TROCAR XCEL BLUNT TIP 100MML (ENDOMECHANICALS) IMPLANT

## 2020-12-23 NOTE — H&P (Signed)
Tracy Little is an 65 y.o. female.    HPI: Tracy Little is a 65 y.o. female who was referred to me for evaluation of gallstones. For the last few months she has been having intermittent epigastric and right upper quadrant abdominal pain. It occurs after she eats rich or fatty meals and is associated with nausea. She denies fevers or chills. She recently had a very severe episode of pain and was seen in the ED on 10/2. EKG showed no ischemic changes. LFTs and lipase were normal. RUQ US showed cholelithiasis without evidence of acute cholecystitis.  Incidentally she also had a 2.4 cm left renal mass on the ultrasound. She she was recently seen by Dr. Abner Greenspan and follow-up MRI did not show a discrete renal mass, but it did incidentally show choledocholithiasis.  She has not had any jaundice or fevers.  She is here today for cholecystectomy with Intra-Op cholangiogram.   Past Medical History:  Diagnosis Date   Arthritis    Cancer [facility])     Past Surgical History:  Procedure Laterality Date   TONSILLECTOMY     TUBAL LIGATION      History reviewed. No pertinent family history. Social History:  reports that she has never smoked. She has never used smokeless tobacco. She reports that she does not drink alcohol and does not use drugs.  Allergies: No Known Allergies  Medications Prior to Admission  Medication Sig Dispense Refill   omeprazole (PRILOSEC) 20 MG capsule Take 1 capsule (20 mg total) by mouth daily. (Patient not taking: No sig reported) 30 capsule 0    No results found for this or any previous visit (from the past 48 hour(s)). No results found.  Review of Systems  Blood pressure 134/85, pulse 69, temperature (!) 97.5 F (36.4 C), temperature source Oral, resp. rate 18, height 5\' 7"  (1.702 m), weight 96.8 kg, last menstrual period 08/09/2014, SpO2 97 %. Physical Exam Vitals reviewed.  Constitutional:      General: She is not in acute distress.    Appearance: Normal  appearance.  HENT:     Head: Normocephalic and atraumatic.  Eyes:     General: No scleral icterus.    Conjunctiva/sclera: Conjunctivae normal.  Pulmonary:     Effort: Pulmonary effort is normal. No respiratory distress.  Abdominal:     General: There is no distension.     Palpations: Abdomen is soft.     Tenderness: There is no abdominal tenderness.  Musculoskeletal:        General: Normal range of motion.     Cervical back: Normal range of motion.  Skin:    General: Skin is warm and dry.     Coloration: Skin is not jaundiced.  Neurological:     General: No focal deficit present.     Mental Status: She is alert and oriented to person, place, and time.  Psychiatric:        Mood and Affect: Mood normal.        Behavior: Behavior normal.        Thought Content: Thought content normal.     Assessment/Plan 65 year old female with symptomatic cholelithiasis and incidental choledocholithiasis on recent MRI.  Common duct stones are nonobstructive.  Proceed to the OR for laparoscopic cholecystectomy with intraoperative cholangiogram.  If common duct stones have not passed, patient will require admission with GI consult for ERCP.  If the stones have already passed spontaneously she will be discharged home from PACU.  I discussed this plan  with the patient and informed consent was obtained.  Dwan Bolt, MD 12/23/2020, 12:36 PM

## 2020-12-23 NOTE — Anesthesia Postprocedure Evaluation (Signed)
Anesthesia Post Note  Patient: Tracy Little  Procedure(s) Performed: LAPAROSCOPIC CHOLECYSTECTOMY WITH INTRAOPERATIVE CHOLANGIOGRAM (Abdomen)     Patient location during evaluation: PACU Anesthesia Type: General Level of consciousness: awake and alert Pain management: pain level controlled Vital Signs Assessment: post-procedure vital signs reviewed and stable Respiratory status: spontaneous breathing, nonlabored ventilation and respiratory function stable Cardiovascular status: blood pressure returned to baseline and stable Postop Assessment: no apparent nausea or vomiting Anesthetic complications: no   No notable events documented.  Last Vitals:  Vitals:   12/23/20 1445 12/23/20 1500  BP: 138/77 126/79  Pulse: 80 77  Resp: 13 17  Temp:    SpO2: 96% 96%    Last Pain:  Vitals:   12/23/20 1500  TempSrc:   PainSc: 0-No pain                 Elfreda Blanchet,W. EDMOND

## 2020-12-23 NOTE — Op Note (Signed)
Date: 12/23/20  Patient: Tracy Little MRN: [id number]  Preoperative Diagnosis: Symptomatic cholelithiasis, choledocholithiasis Postoperative Diagnosis: Same  Procedure: Laparoscopic cholecystectomy with intraoperative cholangiogram  Surgeon: Michaelle Birks, MD  EBL: Minimal  Anesthesia: General endotracheal  Specimens: Gallbladder  Indications: Tracy Little is a 65 year old female who presented with intermittent epigastric and right upper quadrant abdominal pain and was found to have gallstones.  She also recently underwent an MRI for evaluation of a possible kidney mass, and this incidentally showed multiple small filling defects within the distal common bile duct.  She has not had any symptoms of biliary obstruction, thus the decision was made to proceed with cholecystectomy with intraoperative cholangiogram in hopes that the stones would pass spontaneously. She agreed to proceed with laparoscopic cholecystectomy with intraoperative cholangiogram.  Findings: Cholelithiasis with signs of chronic cholecystitis.  Two filling defects within the distal common bile duct, consistent with choledocholithiasis.  Procedure details: Informed consent was obtained in the preoperative area prior to the procedure. The patient was brought to the operating room and placed on the table in the supine position.  General anesthesia was induced and appropriate lines and drains were placed for intraoperative monitoring. Perioperative antibiotics were administered per SCIP guidelines. The abdomen was prepped and draped in the usual sterile fashion. A pre-procedure timeout was taken verifying patient identity, surgical site and procedure to be performed.  A small supraumbilical skin incision was made, the subcutaneous tissue was divided with cautery, and the umbilical stalk was grasped and elevated.  A Veress needle was inserted through the fascia and intraperitoneal placement was confirmed with the saline  drop test.  The abdomen was insufflated, and a 12 mm trocar was placed. The peritoneal cavity was inspected with no evidence of visceral or vascular injury. Three 38mm ports were placed in the right subcostal margin, all under direct visualization. The fundus of the gallbladder was grasped and retracted cephalad.  There were omental adhesions to the gallbladder which were carefully taken down with cautery and blunt dissection.  The infundibulum was retracted laterally. The cystic triangle was dissected out using cautery and blunt dissection, and the critical view of safety was obtained.  The cystic artery was clipped and divided with cautery.  The cystic duct was partially divided sharply, and a cholangiocatheter was then passed via the ductotomy into the cystic duct.  A cholangiogram was performed under fluoroscopy, and demonstrated filling of the entire common bile duct and common hepatic duct up to the bifurcation of the left and right hepatic ducts.  There were 2 mobile filling defects within the distal common bile duct, consistent with stones, with minimal filling of the duodenum.  The biliary tree did not appear dilated.  The cholangiocatheter was removed, and the cystic duct stump was closed with a PDS Endoloop. The gallbladder was taken off the liver using cautery. The specimen was placed in an endocatch bag and removed. The surgical site was irrigated with saline until the effluent was clear. Hemostasis was achieved in the gallbladder fossa using cautery. The cystic duct and artery stumps were visually inspected and there was no evidence of bile leak or bleeding.  The umbilical port site fascia was closed with a 0 PDS suture using a PMI.  The remaining ports were removed under direct visualization and the abdomen was desufflated. The skin at all port sites was closed with 4-0 monocryl subcuticular suture. Dermabond was applied.  The patient tolerated the procedure with no apparent complications.  All  counts were correct x2 at  the end of the procedure. The patient was extubated and taken to PACU in stable condition.  She will be admitted with GI consultation for ERCP.  Michaelle Birks, MD 12/23/20 2:15 PM

## 2020-12-23 NOTE — Transfer of Care (Signed)
Immediate Anesthesia Transfer of Care Note  Patient: Tracy Little  Procedure(s) Performed: LAPAROSCOPIC CHOLECYSTECTOMY WITH INTRAOPERATIVE CHOLANGIOGRAM (Abdomen)  Patient Location: PACU  Anesthesia Type:General  Level of Consciousness: awake, drowsy and responds to stimulation  Airway & Oxygen Therapy: Patient Spontanous Breathing and Patient connected to face mask oxygen  Post-op Assessment: Report given to RN and Post -op Vital signs reviewed and stable  Post vital signs: Reviewed and stable  Last Vitals:  Vitals Value Taken Time  BP 160/90 12/23/20 1419  Temp    Pulse 81 12/23/20 1421  Resp 12 12/23/20 1421  SpO2 99 % 12/23/20 1421  Vitals shown include unvalidated device data.  Last Pain:  Vitals:   12/23/20 1023  TempSrc:   PainSc: 0-No pain         Complications: No notable events documented.

## 2020-12-23 NOTE — Anesthesia Procedure Notes (Signed)

## 2020-12-23 NOTE — Consult Note (Signed)
Referring Provider: Dr. Michaelle Birks Primary Care Physician:  Neale Burly, MD Primary Gastroenterologist:  Althia Forts  Reason for Consultation:  Choledocholithiasis, chronic cholecystitis  HPI: Tracy Little is a 65 y.o. female who is s/p laparoscopic cholecystectomy with intraoperative cholangiogram 12/23/2020.  For the last few months she has been having intermittent epigastric and right upper quadrant abdominal pain. It occurs after she eats rich or fatty meals and is associated with nausea. She denies fevers or chills. She recently had a very severe episode of pain and was seen in the ED on 10/2. EKG showed no ischemic changes. LFTs and lipase were normal. RUQ US showed cholelithiasis without evidence of acute cholecystitis.  Incidentally she also had a 2.4 cm left renal mass on the ultrasound. She she was recently seen by Dr. Abner Greenspan and follow-up MRI did not show a discrete renal mass, but it did incidentally show choledocholithiasis. She has not had any jaundice or fevers.  Surgical team planned to proceed with cholecystectomy as patient was not having any symptoms of biliary obstruction, in hopes that ductal stones would pass on their own.  If found on cholangiogram, would consult GI for ERCP.  Findings on lap chole/cholangiogram:  Cholelithiasis with signs of chronic cholecystitis. Two filling defects within the distal common bile duct, consistent with choledocholithiasis.  The patient tolerated the procedure with no apparent complications.  She was extubated and taken to PACU in stable condition. GI consulted to set up ERCP.  We saw patient in PACU, she was resting comfortably in bed. Patient is aware of the plan for ERCP. She denies fever, chills, nausea, vomiting at this time.  Patient is status post cholecystectomy, denies abdominal pain.  She denies jaundice, itching, or dark urine, both now and over the past couple months. Denies family history of pancreatitis.  Denies MI or  stroke in the last 6 months, denies use of blood thinners.   Checked in on patient again once she arrived in the room.  She is now having pain from gas insufflation, particularly in her right shoulder. She is also having nausea but no vomiting.   Past Medical History:  Diagnosis Date   Arthritis    Cancer [facility])     Past Surgical History:  Procedure Laterality Date   TONSILLECTOMY     TUBAL LIGATION      Prior to Admission medications   Medication Sig Start Date End Date Taking? Authorizing Provider  omeprazole (PRILOSEC) 20 MG capsule Take 1 capsule (20 mg total) by mouth daily. Patient not taking: No sig reported 08/09/15   Comer Locket, PA-C    Scheduled Meds:  acetaminophen  1,000 mg Oral TID   docusate sodium  100 mg Oral BID   Continuous Infusions:  lactated ringers 10 mL/hr at 12/23/20 1244   lactated ringers     PRN Meds:.0.9 % irrigation (POUR BTL), bupivacaine-EPINEPHrine, HYDROmorphone (DILAUDID) injection, lactated ringers, Omnipaque 300 mg/mL (10 ml)in 0.9 % normal saline (10 mL), ondansetron **OR** ondansetron (ZOFRAN) IV, oxyCODONE  Allergies as of 12/14/2020   (No Known Allergies)    History reviewed. No pertinent family history.  Social History   Socioeconomic History   Marital status: Married    Spouse name: Not on file   Number of children: Not on file   Years of education: Not on file   Highest education level: Not on file  Occupational History   Not on file  Tobacco Use   Smoking status: Never   Smokeless tobacco: Never  Vaping Use   Vaping Use: Never used  Substance and Sexual Activity   Alcohol use: No   Drug use: No   Sexual activity: Not on file  Other Topics Concern   Not on file  Social History Narrative   Not on file   Social Determinants of Health   Financial Resource Strain: Not on file  Food Insecurity: Not on file  Transportation Needs: Not on file  Physical Activity: Not on file  Stress: Not on file  Social  Connections: Not on file  Intimate Partner Violence: Not on file    Review of Systems:  Review of Systems  Constitutional:  Negative for chills and fever.  Respiratory:  Negative for shortness of breath.   Cardiovascular:  Negative for chest pain and leg swelling.  Gastrointestinal:  Positive for nausea. Negative for abdominal pain and vomiting.  Genitourinary:        No dark urine  Musculoskeletal:  Negative for falls.       Pain in right shoulder s/p laparascopic surgery  Skin:  Negative for itching.  Neurological:  Negative for loss of consciousness.  Psychiatric/Behavioral:  Negative for memory loss.    Physical Exam: Vital signs: Vitals:   12/23/20 1419 12/23/20 1430  BP: (!) 160/90 (!) 147/86  Pulse: 88 89  Resp: 14 18  Temp: 97.7 F (36.5 C)   SpO2: 100% 94%     General:   Alert, appears uncomfortable but in no acute distress Heart:  Regular rate and rhythm; no murmurs Pulm: Clear anteriorly; no wheezing Abdomen:  Soft AB, skin exam - surgical incision near umbilicus well-closed and dry, Sluggish bowel sounds. No tenderness on palpation, Without guarding and Without rebound, without organomegaly. Extremities:  Without edema. Neurologic:  Alert and  oriented x4;  grossly normal neurologically. Psych:  Alert and cooperative.   GI:  Lab Results: No results for input(s): WBC, HGB, HCT, PLT in the last 72 hours. BMET No results for input(s): NA, K, CL, CO2, GLUCOSE, BUN, CREATININE, CALCIUM in the last 72 hours. LFT No results for input(s): PROT, ALBUMIN, AST, ALT, ALKPHOS, BILITOT, BILIDIR, IBILI in the last 72 hours. PT/INR No results for input(s): LABPROT, INR in the last 72 hours.  Studies/Results: DG Cholangiogram Operative  Result Date: 12/23/2020 CLINICAL DATA:  Cholecystectomy. EXAM: INTRAOPERATIVE CHOLANGIOGRAM COMPARISON:  CT AP, 12/21/2020.  MR abdomen, 12/07/2020. FLUOROSCOPY TIME:  Fluoroscopy Time:  20 seconds Radiation Exposure Index (if provided  by the fluoroscopic device): Not reported. Number of Acquired Spot Images: 104, within cine loop. FINDINGS: Multiple fluoroscopic oblique views of the RIGHT upper quadrant obtained by C-arm. Images demonstrating laparoscopic instrumentation with cystic duct cannulation and antegrade cholangiogram. Trace spillage of contrast at the gallbladder fossa. Nondilated biliary tree, with 2 small filling defects at the distal CBD, near the ampulla. See key image. IMPRESSION: Antegrade cholangiogram, with 2 small filling defects at the distal CBD. For complete description of intra procedural findings, please see performing service dictation. Electronically Signed   By: Michaelle Birks M.D.   On: 12/23/2020 13:57    Impression 1) Choledocholithiasis  Patient is status-post laparoscopic cholecystectomy with intraoperative cholangiogram, which revealed choledocholithiasis.  Patient has not had any symptoms of biliary obstruction.  Labs from 11/14/2020 - ALT, AST, alk phos, total bilirubin all within normal limits.  No fever or jaundice.  Vital signs stable.  2) Chronic cholecystitis  Patient had previously scheduled lap chole today.  Patient tolerated the procedure well and with no apparent complications.  Plan We will schedule ERCP with Dr. Watt Climes for Saturday, 12/25/2020.  I thoroughly discussed procedure with the patient to include nature, alternatives, benefits, and risks (including but not limited to post ERCP pancreatitis, bleeding, infection, perforation, anesthesia/cardiac pulmonary complications).  Patient verbalized understanding and gave verbal consent to proceed with ERCP.   Patient will be NPO at midnight on 12/24/2020 due to possibility of scheduling an earlier ERCP.  May continue regular diet as tolerated until then.  Most recent labs from 11/14/2020 within normal limits. Agree with Cmet today and tomorrow.    LOS: 0 days   Vladimir Crofts  PA-C 12/23/2020, 2:38 PM  Contact #  [phone]

## 2020-12-23 NOTE — Anesthesia Preprocedure Evaluation (Addendum)
Anesthesia Evaluation  Patient identified by MRN, date of birth, ID band Patient awake    Reviewed: Allergy & Precautions, H&P , NPO status , Patient's Chart, lab work & pertinent test results  Airway Mallampati: III  TM Distance: >3 FB Neck ROM: Full    Dental no notable dental hx. (+) Teeth Intact, Dental Advisory Given   Pulmonary neg pulmonary ROS,    Pulmonary exam normal breath sounds clear to auscultation       Cardiovascular negative cardio ROS   Rhythm:Regular Rate:Normal     Neuro/Psych negative neurological ROS  negative psych ROS   GI/Hepatic negative GI ROS, Neg liver ROS,   Endo/Other  negative endocrine ROS  Renal/GU negative Renal ROS  negative genitourinary   Musculoskeletal  (+) Arthritis , Osteoarthritis,    Abdominal   Peds  Hematology negative hematology ROS (+)   Anesthesia Other Findings   Reproductive/Obstetrics negative OB ROS                            Anesthesia Physical Anesthesia Plan  ASA: 2  Anesthesia Plan: General   Post-op Pain Management:    Induction: Intravenous  PONV Risk Score and Plan: 4 or greater and Ondansetron, Dexamethasone and Midazolam  Airway Management Planned: Oral ETT  Additional Equipment:   Intra-op Plan:   Post-operative Plan: Extubation in OR  Informed Consent: I have reviewed the patients History and Physical, chart, labs and discussed the procedure including the risks, benefits and alternatives for the proposed anesthesia with the patient or authorized representative who has indicated his/her understanding and acceptance.     Dental advisory given  Plan Discussed with: CRNA  Anesthesia Plan Comments:         Anesthesia Quick Evaluation

## 2020-12-24 ENCOUNTER — Encounter (HOSPITAL_COMMUNITY): Payer: Self-pay | Admitting: Surgery

## 2020-12-24 DIAGNOSIS — K8064 Calculus of gallbladder and bile duct with chronic cholecystitis without obstruction: Secondary | ICD-10-CM | POA: Diagnosis present

## 2020-12-24 DIAGNOSIS — N2889 Other specified disorders of kidney and ureter: Secondary | ICD-10-CM | POA: Diagnosis present

## 2020-12-24 LAB — SURGICAL PATHOLOGY

## 2020-12-24 MED ORDER — MENTHOL 3 MG MT LOZG
1.0000 | LOZENGE | OROMUCOSAL | Status: DC | PRN
  Filled 2020-12-24: qty 9

## 2020-12-24 NOTE — Progress Notes (Signed)
[facility] Gastroenterology Progress Note  Tracy Little 65 y.o. July 23, 1955  CC:  Choledocholithiasis  Subjective: Patient states pain has improved since surgery yesterday, now 4 out of 10. Patient has not had bowel movement but is passing gas. Patient did walk the halls this morning, no shortness of breath or chest pain. Patient is hungry, denies nausea vomiting.  ROS :  ROS  Objective: Vital signs in last 24 hours: Vitals:   12/24/20 0530 12/24/20 0954  BP: 117/61 (!) 100/55  Pulse: 84 72  Resp: 18 18  Temp: 97.8 F (36.6 C) 97.9 F (36.6 C)  SpO2: 92% 94%    Physical Exam: General:   Alert, appears uncomfortable but in no acute distress Abdomen:  Soft AB, skin exam - surgical incision near umbilicus well-closed and dry, Sluggish bowel sounds. No tenderness on palpation, Without guarding and Without rebound, without organomegaly. Extremities:  Without edema. Neurologic:  Alert and  oriented x4;  grossly normal neurologically. Psych:  Alert and cooperative  Lab Results: Recent Labs    12/23/20 1611  NA 139  K 3.0*  CL 107  CO2 23  GLUCOSE 159*  BUN 18  CREATININE 0.82  CALCIUM 8.4*   Recent Labs    12/23/20 1611  AST 80*  ALT 54*  ALKPHOS 67  BILITOT 0.7  PROT 6.7  ALBUMIN 3.7   No results for input(s): WBC, NEUTROABS, HGB, HCT, MCV, PLT in the last 72 hours. No results for input(s): LABPROT, INR in the last 72 hours.  Lab Results: Results for orders placed or performed during the hospital encounter of 12/23/20 (from the past 48 hour(s))  Comprehensive metabolic panel     Status: Abnormal   Collection Time: 12/23/20  4:11 PM  Result Value Ref Range   Sodium 139 135 - 145 mmol/L   Potassium 3.0 (L) 3.5 - 5.1 mmol/L   Chloride 107 98 - 111 mmol/L   CO2 23 22 - 32 mmol/L   Glucose, Bld 159 (H) 70 - 99 mg/dL    Comment: Glucose reference range applies only to samples taken after fasting for at least 8 hours.   BUN 18 8 - 23 mg/dL   Creatinine,  Ser 0.82 0.44 - 1.00 mg/dL   Calcium 8.4 (L) 8.9 - 10.3 mg/dL   Total Protein 6.7 6.5 - 8.1 g/dL   Albumin 3.7 3.5 - 5.0 g/dL   AST 80 (H) 15 - 41 U/L   ALT 54 (H) 0 - 44 U/L   Alkaline Phosphatase 67 38 - 126 U/L   Total Bilirubin 0.7 0.3 - 1.2 mg/dL   GFR, Estimated >60 >60 mL/min    Comment: (NOTE) Calculated using the CKD-EPI Creatinine Equation (2021)    Anion gap 9 5 - 15    Comment: Performed at [facility], [place] [address] [place], [place] [address]    Assessment/Plan: Choledocholithiasis s/p lap cholecystectomy 12/23/09 AST 80 ALT 54 Alkphos 67 TBili 0.7 Without any symptoms of biliary obstruction.  Normal alk phos, total bilirubin today No fever or jaundice.  Vital signs stable.  Unable to get anesthesia for ERCP today, will do full liquid diet. N.p.o. at midnight. ERCP with Dr. Watt Climes tomorrow.   Vladimir Crofts PA-C 12/24/2020, 10:08 AM  Contact #  [phone]

## 2020-12-24 NOTE — Progress Notes (Signed)
    1 Day Post-Op  Subjective: No acute issues. Pain controlled. Denies nausea/vomiting. Tolerated som PO intake last night. Postop LFTs unremarkable, bilirubin and alk phos normal.  Objective: Vital signs in last 24 hours: Temp:  [97.5 F (36.4 C)-98.6 F (37 C)] 97.8 F (36.6 C) (11/11 0530) Pulse Rate:  [69-90] 84 (11/11 0530) Resp:  [13-18] 18 (11/11 0530) BP: (117-160)/(61-90) 117/61 (11/11 0530) SpO2:  [92 %-100 %] 92 % (11/11 0530) Weight:  [96.8 kg] 96.8 kg (11/10 1000) Last BM Date: 12/22/20  Intake/Output from previous day: 11/10 0701 - 11/11 0700 In: 2280 [P.O.:480; I.V.:1700; IV Piggyback:100] Out: 700 [Urine:700] Intake/Output this shift: No intake/output data recorded.  PE: General: resting comfortably, NAD Neuro: alert and oriented, no focal deficits Resp: normal work of breathing on room air Abdomen: soft, nondistended, nontender to palpation. Incisions clean and dry with no induration or drainage. Extremities: warm and well-perfused   Lab Results:  No results for input(s): WBC, HGB, HCT, PLT in the last 72 hours. BMET Recent Labs    12/23/20 1611  NA 139  K 3.0*  CL 107  CO2 23  GLUCOSE 159*  BUN 18  CREATININE 0.82  CALCIUM 8.4*   PT/INR No results for input(s): LABPROT, INR in the last 72 hours. CMP     Component Value Date/Time   NA 139 12/23/2020 1611   K 3.0 (L) 12/23/2020 1611   CL 107 12/23/2020 1611   CO2 23 12/23/2020 1611   GLUCOSE 159 (H) 12/23/2020 1611   BUN 18 12/23/2020 1611   CREATININE 0.82 12/23/2020 1611   CALCIUM 8.4 (L) 12/23/2020 1611   PROT 6.7 12/23/2020 1611   ALBUMIN 3.7 12/23/2020 1611   AST 80 (H) 12/23/2020 1611   ALT 54 (H) 12/23/2020 1611   ALKPHOS 67 12/23/2020 1611   BILITOT 0.7 12/23/2020 1611   GFRNONAA >60 12/23/2020 1611   GFRAA >60 08/09/2015 1353   Lipase     Component Value Date/Time   LIPASE 40 11/13/2020 2110       Studies/Results: DG Cholangiogram Operative  Result Date:  12/23/2020 CLINICAL DATA:  Cholecystectomy. EXAM: INTRAOPERATIVE CHOLANGIOGRAM COMPARISON:  CT AP, 12/21/2020.  MR abdomen, 12/07/2020. FLUOROSCOPY TIME:  Fluoroscopy Time:  20 seconds Radiation Exposure Index (if provided by the fluoroscopic device): Not reported. Number of Acquired Spot Images: 104, within cine loop. FINDINGS: Multiple fluoroscopic oblique views of the RIGHT upper quadrant obtained by C-arm. Images demonstrating laparoscopic instrumentation with cystic duct cannulation and antegrade cholangiogram. Trace spillage of contrast at the gallbladder fossa. Nondilated biliary tree, with 2 small filling defects at the distal CBD, near the ampulla. See key image. IMPRESSION: Antegrade cholangiogram, with 2 small filling defects at the distal CBD. For complete description of intra procedural findings, please see performing service dictation. Electronically Signed   By: Michaelle Birks M.D.   On: 12/23/2020 13:57        Assessment/Plan  65 yo female POD1 s/p laparoscopic cholecystectomy with intraoperative cholangiogram showing non-obstructive choledocholithiasis. - GI consulted for ERCP, will tentatively be done today vs tomorrow - NPO for procedure - Pain control: scheduled tylenol, prn oxycodone - VTE: SCDs - Dispo: med-surg floor, observation     LOS: 0 days    Michaelle Birks, MD [facility] General, Hepatobiliary and Pancreatic Surgery 12/24/20 7:19 AM

## 2020-12-25 ENCOUNTER — Inpatient Hospital Stay (HOSPITAL_COMMUNITY): Payer: BC Managed Care – PPO | Admitting: Certified Registered"

## 2020-12-25 ENCOUNTER — Encounter (HOSPITAL_COMMUNITY): Payer: Self-pay | Admitting: Surgery

## 2020-12-25 ENCOUNTER — Encounter (HOSPITAL_COMMUNITY)
Admission: RE | Disposition: A | Discharge status: Home / Self Care | Payer: Self-pay | Source: Home / Self Care | Attending: Surgery

## 2020-12-25 ENCOUNTER — Inpatient Hospital Stay (HOSPITAL_COMMUNITY): Payer: BC Managed Care – PPO

## 2020-12-25 HISTORY — PX: ENDOSCOPIC RETROGRADE CHOLANGIOPANCREATOGRAPHY (ERCP) WITH PROPOFOL: SHX5810

## 2020-12-25 HISTORY — PX: REMOVAL OF STONES: SHX5545

## 2020-12-25 HISTORY — PX: SPHINCTEROTOMY: SHX5544

## 2020-12-25 SURGERY — ENDOSCOPIC RETROGRADE CHOLANGIOPANCREATOGRAPHY (ERCP) WITH PROPOFOL
Anesthesia: General

## 2020-12-25 MED ORDER — INDOMETHACIN 50 MG RE SUPP
RECTAL | Status: AC
  Filled 2020-12-25: qty 1

## 2020-12-25 MED ORDER — PHENYLEPHRINE HCL-NACL 20-0.9 MG/250ML-% IV SOLN
INTRAVENOUS | Status: DC | PRN
  Administered 2020-12-25: 25 ug/min via INTRAVENOUS

## 2020-12-25 MED ORDER — LIDOCAINE 2% (20 MG/ML) 5 ML SYRINGE
INTRAMUSCULAR | Status: DC | PRN
  Administered 2020-12-25 (×2): 50 mg via INTRAVENOUS

## 2020-12-25 MED ORDER — PROPOFOL 1000 MG/100ML IV EMUL
INTRAVENOUS | Status: AC
  Filled 2020-12-25: qty 200

## 2020-12-25 MED ORDER — MIDAZOLAM HCL 2 MG/2ML IJ SOLN
INTRAMUSCULAR | Status: DC | PRN
  Administered 2020-12-25 (×2): 1 mg via INTRAVENOUS

## 2020-12-25 MED ORDER — DEXAMETHASONE SODIUM PHOSPHATE 10 MG/ML IJ SOLN
INTRAMUSCULAR | Status: DC | PRN
  Administered 2020-12-25: 4 mg via INTRAVENOUS

## 2020-12-25 MED ORDER — PROPOFOL 10 MG/ML IV BOLUS
INTRAVENOUS | Status: DC | PRN
  Administered 2020-12-25: 200 mg via INTRAVENOUS

## 2020-12-25 MED ORDER — ONDANSETRON HCL 4 MG/2ML IJ SOLN
INTRAMUSCULAR | Status: DC | PRN
  Administered 2020-12-25: 4 mg via INTRAVENOUS

## 2020-12-25 MED ORDER — SUGAMMADEX SODIUM 500 MG/5ML IV SOLN
INTRAVENOUS | Status: DC | PRN
  Administered 2020-12-25: 300 mg via INTRAVENOUS

## 2020-12-25 MED ORDER — FENTANYL CITRATE (PF) 250 MCG/5ML IJ SOLN
INTRAMUSCULAR | Status: DC | PRN
  Administered 2020-12-25: 25 ug via INTRAVENOUS
  Administered 2020-12-25: 50 ug via INTRAVENOUS
  Administered 2020-12-25: 25 ug via INTRAVENOUS

## 2020-12-25 MED ORDER — CIPROFLOXACIN IN D5W 400 MG/200ML IV SOLN
INTRAVENOUS | Status: AC
  Filled 2020-12-25: qty 200

## 2020-12-25 MED ORDER — SODIUM CHLORIDE 0.9 % IV SOLN: INTRAVENOUS | Status: DC | PRN

## 2020-12-25 MED ORDER — PHENYLEPHRINE HCL (PRESSORS) 10 MG/ML IV SOLN
INTRAVENOUS | Status: AC
  Filled 2020-12-25: qty 2

## 2020-12-25 MED ORDER — MIDAZOLAM HCL 2 MG/2ML IJ SOLN
INTRAMUSCULAR | Status: AC
  Filled 2020-12-25: qty 2

## 2020-12-25 MED ORDER — PROPOFOL 10 MG/ML IV BOLUS
INTRAVENOUS | Status: AC
  Filled 2020-12-25: qty 20

## 2020-12-25 MED ORDER — FENTANYL CITRATE (PF) 100 MCG/2ML IJ SOLN
INTRAMUSCULAR | Status: AC
  Filled 2020-12-25: qty 2

## 2020-12-25 MED ORDER — LACTATED RINGERS IV SOLN: INTRAVENOUS | Status: DC | PRN

## 2020-12-25 MED ORDER — TRAMADOL HCL 50 MG PO TABS: 50.0000 mg | ORAL_TABLET | Freq: Four times a day (QID) | ORAL | 0 refills | Status: DC | PRN

## 2020-12-25 MED ORDER — GLUCAGON HCL RDNA (DIAGNOSTIC) 1 MG IJ SOLR
INTRAMUSCULAR | Status: AC
  Filled 2020-12-25: qty 1

## 2020-12-25 MED ORDER — ROCURONIUM BROMIDE 10 MG/ML (PF) SYRINGE
PREFILLED_SYRINGE | INTRAVENOUS | Status: DC | PRN
  Administered 2020-12-25: 50 mg via INTRAVENOUS

## 2020-12-25 MED ORDER — CIPROFLOXACIN IN D5W 400 MG/200ML IV SOLN
INTRAVENOUS | Status: DC | PRN
  Administered 2020-12-25: 400 mg via INTRAVENOUS

## 2020-12-25 NOTE — Discharge Summary (Signed)
Physician Discharge Summary  Patient ID: Tracy Little MRN: [id number] DOB/AGE: 1955/07/17 65 y.o.  Admit date: 12/23/2020 Discharge date: 12/25/2020  Admission Diagnoses:  Discharge Diagnoses:  Active Problems:   Choledocholithiasis   Discharged Condition: good  Hospital Course: admitted s/p lap chole.  Underwent ERCP POD#1.  Did very well.  Discharged home later that day  Consults: GI  Significant Diagnostic Studies:   Treatments: surgery: lap chole with IOC, ERCP  Discharge Exam: Blood pressure 105/71, pulse 62, temperature 97.7 F (36.5 C), resp. rate (!) 22, height 5\' 7"  (1.702 m), weight 96.8 kg, last menstrual period 08/09/2014, SpO2 94 %. General appearance: alert, cooperative, and no distress Resp: clear to auscultation bilaterally Cardio: regular rate and rhythm, S1, S2 normal, no murmur, click, rub or gallop Incision/Wound: abdomen soft, incisions clean, minimally tender  Disposition: Discharge disposition: 01-Home or Self Care        Allergies as of 12/25/2020   No Known Allergies      Medication List     STOP taking these medications    omeprazole 20 MG capsule Commonly known as: PRILOSEC       TAKE these medications    traMADol 50 MG tablet Commonly known as: Ultram Take 1-2 tablets (50-100 mg total) by mouth every 6 (six) hours as needed for moderate pain or severe pain.        Follow-up Information     Dwan Bolt, MD Follow up in 3 week(s).   Specialty: General Surgery Contact information: [place]. [address] [address] [phone]                 Signed: Coralie Keens 12/25/2020, 10:57 AM

## 2020-12-25 NOTE — Op Note (Signed)
[facility] Patient Name: Tracy Little Procedure Date: 12/25/2020 MRN: [id number] Attending MD: Clarene Essex , MD Date of Birth: Apr 28, 1955 CSN: [id number] Age: 65 Admit Type: Inpatient Procedure:                ERCP Indications:              For therapy of bile duct stone(s) Providers:                Clarene Essex, MD, Elmer Ramp. Hinson, RN, Janie Billups,                            Technician, Glenis Smoker, CRNA Referring MD:              Medicines:                General Anesthesia Complications:            No immediate complications. Estimated Blood Loss:     Estimated blood loss was minimal. Procedure:                Pre-Anesthesia Assessment:                           - Prior to the procedure, a History and Physical                            was performed, and patient medications and                            allergies were reviewed. The patient's tolerance of                            previous anesthesia was also reviewed. The risks                            and benefits of the procedure and the sedation                            options and risks were discussed with the patient.                            All questions were answered, and informed consent                            was obtained. Prior Anticoagulants: The patient has                            taken no previous anticoagulant or antiplatelet                            agents. ASA Grade Assessment: II - A patient with                            mild systemic disease. After reviewing the risks  and benefits, the patient was deemed in                            satisfactory condition to undergo the procedure.                           After obtaining informed consent, the scope was                            passed under direct vision. Throughout the                            procedure, the patient's blood pressure, pulse, and                            oxygen  saturations were monitored continuously. The                            [id number]-Q180V ([id number] Olympus duodenoscope was                            introduced through the mouth, and used to inject                            contrast into and used to cannulate the bile duct.                            The ERCP was accomplished without difficulty. The                            patient tolerated the procedure well. Scope In: Scope Out: Findings:      The major papilla was normal. Deep selective cannulation was readily       obtained and on initial cholangiogram at least 1 small stone was seen       and we proceeded with a biliary sphincterotomy was made with a Hydratome       sphincterotome using ERBE electrocautery. There was no       post-sphincterotomy bleeding. We extended the sphincterotomy site until       we had adequate biliary drainage and we could insert the fully bowed       sphincterotome easily in and out of the duct and choledocholithiasis was       found in a minimally dilated duct. The biliary tree was swept with an       adjustable 12- 15 mm balloon starting at the bifurcation. Sludge was       swept from the duct. Multiple balloon pull-through's were done using       both balloons but no obvious stone was seen to pass and nothing was       found on occlusion cholangiogram at the end of the procedure. With the       last balloon pull-through there was minimal oozing for a short time       which stopped readily on its own and there was no pancreatic duct       injection or wire advancement throughout the procedure and the  patient       tolerated the procedure well Impression:               - The major papilla appeared normal.                           - Choledocholithiasis was found on initial                            cholangiogram. Complete removal was accomplished by                            balloon extraction as above.                           - A biliary sphincterotomy  was performed.                           - The biliary tree was swept and sludge was seen to                            pass through the patent sphincterotomy site but no                            obvious stones and nothing was found on subsequent                            balloon pull-through's and occlusion cholangiogram. Moderate Sedation:      Not Applicable - Patient had care per Anesthesia. Recommendation:           - Clear liquid diet for 6 hours. If doing well may                            have soft solids and if does well with diet call                            hospital team and surgeons for consideration of                            discharge today                           - Continue present medications.                           - Return to GI clinic PRN.                           - Telephone GI clinic if symptomatic PRN. Procedure Code(s):        --- Professional ---                           [phone], Endoscopic retrograde  cholangiopancreatography (ERCP); with removal of                            calculi/debris from biliary/pancreatic duct(s)                           43262, Endoscopic retrograde                            cholangiopancreatography (ERCP); with                            sphincterotomy/papillotomy Diagnosis Code(s):        --- Professional ---                           K80.50, Calculus of bile duct without cholangitis                            or cholecystitis without obstruction CPT copyright 2019 [employer]. All rights reserved. The codes documented in this report are preliminary and upon coder review may  be revised to meet current compliance requirements. Clarene Essex, MD 12/25/2020 9:22:46 AM This report has been signed electronically. Number of Addenda: 0

## 2020-12-25 NOTE — Progress Notes (Signed)
Tracy Little 8:25 AM  Subjective: Patient seen and examined in hospital computer chart reviewed including MRCP and Intra-Op cholangiogram and case discussed with my partner Dr. Therisa Doyne and she has no current complaints and we rediscussed the procedure  Objective: Vital signs stable afebrile no acute distress exam please see preassessment evaluation no new labs today x-rays reviewed  Assessment: Probable CBD stone  Plan: The risk benefits methods and success rate was rediscussed and will proceed today with anesthesia assistance  [facility]  office [phone] After 5PM or if no answer call [phone]

## 2020-12-25 NOTE — Transfer of Care (Signed)
Immediate Anesthesia Transfer of Care Note  Patient: Tracy Little  Procedure(s) Performed: ENDOSCOPIC RETROGRADE CHOLANGIOPANCREATOGRAPHY (ERCP) WITH PROPOFOL SPHINCTEROTOMY REMOVAL OF STONES  Patient Location: PACU  Anesthesia Type:General  Level of Consciousness: awake and alert   Airway & Oxygen Therapy: Patient Spontanous Breathing and Patient connected to face mask oxygen  Post-op Assessment: Report given to RN and Post -op Vital signs reviewed and stable  Post vital signs: Reviewed and stable  Last Vitals:  Vitals Value Taken Time  BP 123/75 12/25/20 0935  Temp    Pulse 73 12/25/20 0937  Resp 29 12/25/20 0937  SpO2 100 % 12/25/20 0937  Vitals shown include unvalidated device data.  Last Pain:  Vitals:   12/25/20 0746  TempSrc: Oral  PainSc: 0-No pain      Patients Stated Pain Goal: 2 (64/35/39 1225)  Complications: No notable events documented.

## 2020-12-25 NOTE — Anesthesia Procedure Notes (Signed)
Procedure Name: Intubation Date/Time: 12/25/2020 8:32 AM Performed by: Cynda Familia, CRNA Pre-anesthesia Checklist: Patient identified, Emergency Drugs available, Suction available and Patient being monitored Patient Re-evaluated:Patient Re-evaluated prior to induction Oxygen Delivery Method: Circle System Utilized Preoxygenation: Pre-oxygenation with 100% oxygen Induction Type: IV induction Ventilation: Mask ventilation without difficulty Laryngoscope Size: Miller and 2 Grade View: Grade I Tube type: Oral Number of attempts: 1 Airway Equipment and Method: Stylet Placement Confirmation: ETT inserted through vocal cords under direct vision, positive ETCO2 and breath sounds checked- equal and bilateral Secured at: 22 cm Tube secured with: Tape Dental Injury: Teeth and Oropharynx as per pre-operative assessment  Comments: Smooth IV induction Hodierne - intubation AM CRNA atraumatic- teeth and mouth as preop bilat BS

## 2020-12-25 NOTE — Anesthesia Procedure Notes (Signed)
Date/Time: 12/25/2020 9:22 AM Performed by: Cynda Familia, CRNA Oxygen Delivery Method: Simple face mask Placement Confirmation: positive ETCO2 and breath sounds checked- equal and bilateral Dental Injury: Teeth and Oropharynx as per pre-operative assessment

## 2020-12-25 NOTE — Progress Notes (Signed)
Patient ID: Tracy Little, female   DOB: 1955-10-21, 65 y.o.   MRN: [id number]  Doing well Looks great.  Had ERCP this morning Awake and alert Abdomen soft, Denies pain.  Plan:  discharge home this afternoon

## 2020-12-25 NOTE — Progress Notes (Signed)
Pt alert and oriented, tolerating diet. Given d/c instructions and d/cd home.

## 2020-12-25 NOTE — Anesthesia Preprocedure Evaluation (Signed)
Anesthesia Evaluation  Patient identified by MRN, date of birth, ID band Patient awake    Reviewed: Allergy & Precautions, H&P , NPO status , Patient's Chart, lab work & pertinent test results  Airway Mallampati: II   Neck ROM: full    Dental   Pulmonary neg pulmonary ROS,    breath sounds clear to auscultation       Cardiovascular negative cardio ROS   Rhythm:regular Rate:Normal     Neuro/Psych    GI/Hepatic choledocholithiasis   Endo/Other  obese  Renal/GU      Musculoskeletal  (+) Arthritis ,   Abdominal   Peds  Hematology   Anesthesia Other Findings   Reproductive/Obstetrics                             Anesthesia Physical Anesthesia Plan  ASA: 2  Anesthesia Plan: General   Post-op Pain Management:    Induction: Intravenous  PONV Risk Score and Plan: 3 and Ondansetron, Dexamethasone, Midazolam and Treatment may vary due to age or medical condition  Airway Management Planned: Oral ETT  Additional Equipment:   Intra-op Plan:   Post-operative Plan: Extubation in OR  Informed Consent: I have reviewed the patients History and Physical, chart, labs and discussed the procedure including the risks, benefits and alternatives for the proposed anesthesia with the patient or authorized representative who has indicated his/her understanding and acceptance.     Dental advisory given  Plan Discussed with: CRNA, Anesthesiologist and Surgeon  Anesthesia Plan Comments:         Anesthesia Quick Evaluation

## 2020-12-25 NOTE — Discharge Instructions (Signed)
[facility] ______[facility] LAPAROSCOPIC SURGERY: POST OP INSTRUCTIONS Always review your discharge instruction sheet given to you by the facility where your surgery was performed. IF YOU HAVE DISABILITY OR FAMILY LEAVE FORMS, YOU MUST BRING THEM TO THE OFFICE FOR PROCESSING.   DO NOT GIVE THEM TO YOUR DOCTOR.  A prescription for pain medication may be given to you upon discharge.  Take your pain medication as prescribed, if needed.  If narcotic pain medicine is not needed, then you may take acetaminophen (Tylenol) or ibuprofen (Advil) as needed. Take your usually prescribed medications unless otherwise directed. If you need a refill on your pain medication, please contact your pharmacy.  They will contact our office to request authorization. Prescriptions will not be filled after 5pm or on week-ends. You should follow a light diet the first few days after arrival home, such as soup and crackers, etc.  Be sure to include lots of fluids daily. Most patients will experience some swelling and bruising in the area of the incisions.  Ice packs will help.  Swelling and bruising can take several days to resolve.  It is common to experience some constipation if taking pain medication after surgery.  Increasing fluid intake and taking a stool softener (such as Colace) will usually help or prevent this problem from occurring.  A mild laxative (Milk of Magnesia or Miralax) should be taken according to package instructions if there are no bowel movements after 48 hours. Unless discharge instructions indicate otherwise, you may remove your bandages 24-48 hours after surgery, and you may shower at that time.  You may have steri-strips (small skin tapes) in place directly over the incision.  These strips should be left on the skin for 7-10 days.  If your surgeon used skin glue on the incision, you may shower in 24 hours.  The glue will flake off over the next 2-3 weeks.  Any sutures or staples will be  removed at the office during your follow-up visit. ACTIVITIES:  You may resume regular (light) daily activities beginning the next day--such as daily self-care, walking, climbing stairs--gradually increasing activities as tolerated.  You may have sexual intercourse when it is comfortable.  Refrain from any heavy lifting or straining until approved by your doctor. You may drive when you are no longer taking prescription pain medication, you can comfortably wear a seatbelt, and you can safely maneuver your car and apply brakes. RETURN TO WORK:  __________________________________________________________ You should see your doctor in the office for a follow-up appointment approximately 2-3 weeks after your surgery.  Make sure that you call for this appointment within a day or two after you arrive home to insure a convenient appointment time. OTHER INSTRUCTIONS: __________________________________________________________________________________________________________________________ __________________________________________________________________________________________________________________________ WHEN TO CALL YOUR DOCTOR: Fever over 101.0 Inability to urinate Continued bleeding from incision. Increased pain, redness, or drainage from the incision. Increasing abdominal pain  The clinic staff is available to answer your questions during regular business hours.  Please don't hesitate to call and ask to speak to one of the nurses for clinical concerns.  If you have a medical emergency, go to the nearest emergency room or call 911.  A surgeon from [facility] is always on call at the hospital. [address], [address], [place], [place]  [address] ? [address] [address], [place], [place]   [address] [phone] ? [phone] ? FAX [phone] Web site: [email or web address]  

## 2020-12-25 NOTE — Anesthesia Postprocedure Evaluation (Signed)
Anesthesia Post Note  Patient: Tracy Little  Procedure(s) Performed: ENDOSCOPIC RETROGRADE CHOLANGIOPANCREATOGRAPHY (ERCP) WITH PROPOFOL SPHINCTEROTOMY REMOVAL OF STONES     Patient location during evaluation: PACU Anesthesia Type: General Level of consciousness: awake and alert Pain management: pain level controlled Vital Signs Assessment: post-procedure vital signs reviewed and stable Respiratory status: spontaneous breathing, nonlabored ventilation, respiratory function stable and patient connected to nasal cannula oxygen Cardiovascular status: blood pressure returned to baseline and stable Postop Assessment: no apparent nausea or vomiting Anesthetic complications: no   No notable events documented.  Last Vitals:  Vitals:   12/25/20 1000 12/25/20 1015  BP: 127/74 105/71  Pulse: 67 62  Resp: (!) 21 (!) 22  Temp:  36.5 C  SpO2: 93% 94%    Last Pain:  Vitals:   12/25/20 1015  TempSrc:   PainSc: 0-No pain                 Zariyah Stephens S

## 2020-12-27 ENCOUNTER — Encounter (HOSPITAL_COMMUNITY): Payer: Self-pay | Admitting: Gastroenterology

## 2021-01-18 DIAGNOSIS — R3121 Asymptomatic microscopic hematuria: Secondary | ICD-10-CM | POA: Diagnosis not present

## 2021-01-18 DIAGNOSIS — N3020 Other chronic cystitis without hematuria: Secondary | ICD-10-CM | POA: Diagnosis not present

## 2021-02-22 DIAGNOSIS — I700 Atherosclerosis of aorta: Secondary | ICD-10-CM | POA: Diagnosis not present

## 2021-02-22 DIAGNOSIS — E7849 Other hyperlipidemia: Secondary | ICD-10-CM | POA: Diagnosis not present

## 2021-02-22 DIAGNOSIS — Z6832 Body mass index (BMI) 32.0-32.9, adult: Secondary | ICD-10-CM | POA: Diagnosis not present

## 2021-02-22 DIAGNOSIS — I10 Essential (primary) hypertension: Secondary | ICD-10-CM | POA: Diagnosis not present

## 2021-04-15 DIAGNOSIS — Z08 Encounter for follow-up examination after completed treatment for malignant neoplasm: Secondary | ICD-10-CM | POA: Diagnosis not present

## 2021-04-15 DIAGNOSIS — L538 Other specified erythematous conditions: Secondary | ICD-10-CM | POA: Diagnosis not present

## 2021-04-15 DIAGNOSIS — L814 Other melanin hyperpigmentation: Secondary | ICD-10-CM | POA: Diagnosis not present

## 2021-04-15 DIAGNOSIS — R208 Other disturbances of skin sensation: Secondary | ICD-10-CM | POA: Diagnosis not present

## 2021-04-15 DIAGNOSIS — D1801 Hemangioma of skin and subcutaneous tissue: Secondary | ICD-10-CM | POA: Diagnosis not present

## 2021-04-15 DIAGNOSIS — L298 Other pruritus: Secondary | ICD-10-CM | POA: Diagnosis not present

## 2021-04-15 DIAGNOSIS — L820 Inflamed seborrheic keratosis: Secondary | ICD-10-CM | POA: Diagnosis not present

## 2021-04-15 DIAGNOSIS — L821 Other seborrheic keratosis: Secondary | ICD-10-CM | POA: Diagnosis not present

## 2021-04-15 DIAGNOSIS — C44319 Basal cell carcinoma of skin of other parts of face: Secondary | ICD-10-CM | POA: Diagnosis not present

## 2021-04-15 DIAGNOSIS — D485 Neoplasm of uncertain behavior of skin: Secondary | ICD-10-CM | POA: Diagnosis not present

## 2021-06-08 DIAGNOSIS — E7849 Other hyperlipidemia: Secondary | ICD-10-CM | POA: Diagnosis not present

## 2021-06-08 DIAGNOSIS — I700 Atherosclerosis of aorta: Secondary | ICD-10-CM | POA: Diagnosis not present

## 2021-06-08 DIAGNOSIS — Z6832 Body mass index (BMI) 32.0-32.9, adult: Secondary | ICD-10-CM | POA: Diagnosis not present

## 2021-06-08 DIAGNOSIS — I10 Essential (primary) hypertension: Secondary | ICD-10-CM | POA: Diagnosis not present

## 2021-09-08 DIAGNOSIS — C44319 Basal cell carcinoma of skin of other parts of face: Secondary | ICD-10-CM | POA: Diagnosis not present

## 2021-09-08 DIAGNOSIS — D485 Neoplasm of uncertain behavior of skin: Secondary | ICD-10-CM | POA: Diagnosis not present

## 2021-10-10 DIAGNOSIS — M545 Low back pain, unspecified: Secondary | ICD-10-CM | POA: Diagnosis not present

## 2021-10-10 DIAGNOSIS — E7849 Other hyperlipidemia: Secondary | ICD-10-CM | POA: Diagnosis not present

## 2021-10-10 DIAGNOSIS — I700 Atherosclerosis of aorta: Secondary | ICD-10-CM | POA: Diagnosis not present

## 2021-10-10 DIAGNOSIS — I10 Essential (primary) hypertension: Secondary | ICD-10-CM | POA: Diagnosis not present

## 2021-10-13 DIAGNOSIS — M7989 Other specified soft tissue disorders: Secondary | ICD-10-CM | POA: Diagnosis not present

## 2021-10-13 DIAGNOSIS — M7121 Synovial cyst of popliteal space [Baker], right knee: Secondary | ICD-10-CM | POA: Diagnosis not present

## 2021-10-13 DIAGNOSIS — M79604 Pain in right leg: Secondary | ICD-10-CM | POA: Diagnosis not present

## 2021-10-13 DIAGNOSIS — M79661 Pain in right lower leg: Secondary | ICD-10-CM | POA: Diagnosis not present

## 2022-01-20 DIAGNOSIS — M21611 Bunion of right foot: Secondary | ICD-10-CM | POA: Diagnosis not present

## 2022-01-20 DIAGNOSIS — M7741 Metatarsalgia, right foot: Secondary | ICD-10-CM | POA: Diagnosis not present

## 2022-01-20 DIAGNOSIS — R2241 Localized swelling, mass and lump, right lower limb: Secondary | ICD-10-CM | POA: Diagnosis not present

## 2022-01-20 DIAGNOSIS — M79671 Pain in right foot: Secondary | ICD-10-CM | POA: Diagnosis not present

## 2022-01-30 DIAGNOSIS — R2241 Localized swelling, mass and lump, right lower limb: Secondary | ICD-10-CM | POA: Diagnosis not present

## 2022-02-20 DIAGNOSIS — M21611 Bunion of right foot: Secondary | ICD-10-CM | POA: Diagnosis not present

## 2022-02-20 DIAGNOSIS — R2241 Localized swelling, mass and lump, right lower limb: Secondary | ICD-10-CM | POA: Diagnosis not present

## 2022-02-20 DIAGNOSIS — M21621 Bunionette of right foot: Secondary | ICD-10-CM | POA: Diagnosis not present

## 2022-02-20 DIAGNOSIS — M2021 Hallux rigidus, right foot: Secondary | ICD-10-CM | POA: Diagnosis not present

## 2022-02-22 ENCOUNTER — Other Ambulatory Visit (HOSPITAL_COMMUNITY): Payer: Self-pay | Admitting: Orthopedic Surgery

## 2022-03-31 ENCOUNTER — Encounter (HOSPITAL_BASED_OUTPATIENT_CLINIC_OR_DEPARTMENT_OTHER): Payer: Self-pay | Admitting: Orthopedic Surgery

## 2022-03-31 NOTE — Progress Notes (Signed)
   03/31/22 0946  PAT Phone Screen  Is the patient taking a GLP-1 receptor agonist? No  Do You Have Diabetes? No  Do You Have Hypertension? (S)  Yes (patient denies but is prescribed metoprolol and chlorthalidone))  Have You Ever Been to the ER for Asthma? No  Have You Taken Oral Steroids in the Past 3 Months? No  Do you Take Phenteramine or any Other Diet Drugs? No  Recent  Lab Work, EKG, CXR? No  Do you have a history of heart problems? No  Any Recent Hospitalizations? No  Height 5' 7"$  (1.702 m)  Weight 99.8 kg  Pat Appointment Scheduled (S)  Yes (BMP,EKG,ERAS, BP check)   Patient denies HTN or any prescribed medications on PAT call. After further investigation and questioning patient states she took herself off of prescribed medications. (Metoprolol and chlorthalidone)   PAT nurse expressed to patient that for her DOS she will need to get that cleared with her prescribing physician to be off of medications (if they are deemed unnecessary) or else she is at risk for a DOS cancellation based on the Anesthesiologist discretion.   She is to come in to PAT lab prior to DOS for BMP,EKG and BP check. LVM with Dr. Renelda Loma office to make them aware of this situation.

## 2022-04-03 ENCOUNTER — Encounter (HOSPITAL_BASED_OUTPATIENT_CLINIC_OR_DEPARTMENT_OTHER)
Admission: RE | Admit: 2022-04-03 | Discharge: 2022-04-03 | Disposition: A | Discharge status: Home / Self Care | Payer: BC Managed Care – PPO | Source: Ambulatory Visit | Attending: Orthopedic Surgery | Admitting: Orthopedic Surgery

## 2022-04-03 DIAGNOSIS — Z01818 Encounter for other preprocedural examination: Secondary | ICD-10-CM | POA: Insufficient documentation

## 2022-04-03 DIAGNOSIS — M21611 Bunion of right foot: Secondary | ICD-10-CM | POA: Diagnosis not present

## 2022-04-03 DIAGNOSIS — Z79899 Other long term (current) drug therapy: Secondary | ICD-10-CM | POA: Diagnosis not present

## 2022-04-03 DIAGNOSIS — M2041 Other hammer toe(s) (acquired), right foot: Secondary | ICD-10-CM | POA: Diagnosis not present

## 2022-04-03 DIAGNOSIS — D3610 Benign neoplasm of peripheral nerves and autonomic nervous system, unspecified: Secondary | ICD-10-CM | POA: Diagnosis not present

## 2022-04-03 DIAGNOSIS — M21621 Bunionette of right foot: Secondary | ICD-10-CM | POA: Diagnosis not present

## 2022-04-03 DIAGNOSIS — M2021 Hallux rigidus, right foot: Secondary | ICD-10-CM | POA: Diagnosis not present

## 2022-04-03 DIAGNOSIS — M1990 Unspecified osteoarthritis, unspecified site: Secondary | ICD-10-CM | POA: Diagnosis not present

## 2022-04-03 DIAGNOSIS — M7741 Metatarsalgia, right foot: Secondary | ICD-10-CM | POA: Diagnosis not present

## 2022-04-03 DIAGNOSIS — I10 Essential (primary) hypertension: Secondary | ICD-10-CM | POA: Diagnosis not present

## 2022-04-03 LAB — BASIC METABOLIC PANEL
Anion gap: 9 (ref 5–15)
BUN: 17 mg/dL (ref 8–23)
CO2: 29 mmol/L (ref 22–32)
Calcium: 9.4 mg/dL (ref 8.9–10.3)
Chloride: 101 mmol/L (ref 98–111)
Creatinine, Ser: 1.09 mg/dL — ABNORMAL HIGH (ref 0.44–1.00)
GFR, Estimated: 56 mL/min — ABNORMAL LOW (ref >60)
Glucose, Bld: 95 mg/dL (ref 70–99)
Potassium: 3.6 mmol/L (ref 3.5–5.1)
Sodium: 139 mmol/L (ref 135–145)

## 2022-04-06 ENCOUNTER — Encounter (HOSPITAL_BASED_OUTPATIENT_CLINIC_OR_DEPARTMENT_OTHER)
Admission: RE | Disposition: A | Discharge status: Home / Self Care | Payer: Self-pay | Source: Home / Self Care | Attending: Orthopedic Surgery

## 2022-04-06 ENCOUNTER — Ambulatory Visit (HOSPITAL_BASED_OUTPATIENT_CLINIC_OR_DEPARTMENT_OTHER): Payer: BC Managed Care – PPO | Admitting: Anesthesiology

## 2022-04-06 ENCOUNTER — Encounter (HOSPITAL_BASED_OUTPATIENT_CLINIC_OR_DEPARTMENT_OTHER): Payer: Self-pay | Admitting: Orthopedic Surgery

## 2022-04-06 ENCOUNTER — Ambulatory Visit (HOSPITAL_BASED_OUTPATIENT_CLINIC_OR_DEPARTMENT_OTHER): Payer: BC Managed Care – PPO

## 2022-04-06 ENCOUNTER — Other Ambulatory Visit: Payer: Self-pay

## 2022-04-06 ENCOUNTER — Ambulatory Visit (HOSPITAL_BASED_OUTPATIENT_CLINIC_OR_DEPARTMENT_OTHER)
Admission: RE | Admit: 2022-04-06 | Discharge: 2022-04-06 | Disposition: A | Discharge status: Home / Self Care | Payer: BC Managed Care – PPO | Attending: Orthopedic Surgery | Admitting: Orthopedic Surgery

## 2022-04-06 DIAGNOSIS — M21611 Bunion of right foot: Secondary | ICD-10-CM | POA: Diagnosis not present

## 2022-04-06 DIAGNOSIS — M1990 Unspecified osteoarthritis, unspecified site: Secondary | ICD-10-CM | POA: Insufficient documentation

## 2022-04-06 DIAGNOSIS — I10 Essential (primary) hypertension: Secondary | ICD-10-CM | POA: Insufficient documentation

## 2022-04-06 DIAGNOSIS — Z79899 Other long term (current) drug therapy: Secondary | ICD-10-CM | POA: Insufficient documentation

## 2022-04-06 DIAGNOSIS — M2041 Other hammer toe(s) (acquired), right foot: Secondary | ICD-10-CM | POA: Diagnosis not present

## 2022-04-06 DIAGNOSIS — M7741 Metatarsalgia, right foot: Secondary | ICD-10-CM | POA: Insufficient documentation

## 2022-04-06 DIAGNOSIS — D3610 Benign neoplasm of peripheral nerves and autonomic nervous system, unspecified: Secondary | ICD-10-CM | POA: Diagnosis not present

## 2022-04-06 DIAGNOSIS — Z01818 Encounter for other preprocedural examination: Secondary | ICD-10-CM

## 2022-04-06 DIAGNOSIS — M21621 Bunionette of right foot: Secondary | ICD-10-CM | POA: Insufficient documentation

## 2022-04-06 DIAGNOSIS — M24574 Contracture, right foot: Secondary | ICD-10-CM | POA: Diagnosis not present

## 2022-04-06 DIAGNOSIS — G8918 Other acute postprocedural pain: Secondary | ICD-10-CM | POA: Diagnosis not present

## 2022-04-06 DIAGNOSIS — R2241 Localized swelling, mass and lump, right lower limb: Secondary | ICD-10-CM | POA: Diagnosis not present

## 2022-04-06 DIAGNOSIS — M2021 Hallux rigidus, right foot: Secondary | ICD-10-CM | POA: Diagnosis not present

## 2022-04-06 HISTORY — PX: ARTHRODESIS METATARSALPHALANGEAL JOINT (MTPJ): SHX6566

## 2022-04-06 HISTORY — PX: BUNIONECTOMY: SHX129

## 2022-04-06 HISTORY — PX: EXCISION MASS LOWER EXTREMETIES: SHX6705

## 2022-04-06 HISTORY — DX: Essential (primary) hypertension: I10

## 2022-04-06 HISTORY — PX: HAMMERTOE RECONSTRUCTION WITH WEIL OSTEOTOMY: SHX5631

## 2022-04-06 SURGERY — FUSION, JOINT, GREAT TOE
Anesthesia: Regional | Site: Toe | Laterality: Right

## 2022-04-06 MED ORDER — FENTANYL CITRATE (PF) 100 MCG/2ML IJ SOLN: 25.0000 ug | INTRAMUSCULAR | Status: DC | PRN

## 2022-04-06 MED ORDER — MIDAZOLAM HCL 2 MG/2ML IJ SOLN
2.0000 mg | Freq: Once | INTRAMUSCULAR | Status: AC
  Administered 2022-04-06: 2 mg via INTRAVENOUS

## 2022-04-06 MED ORDER — FENTANYL CITRATE (PF) 100 MCG/2ML IJ SOLN
100.0000 ug | Freq: Once | INTRAMUSCULAR | Status: AC
  Administered 2022-04-06: 100 ug via INTRAVENOUS

## 2022-04-06 MED ORDER — BUPIVACAINE LIPOSOME 1.3 % IJ SUSP
INTRAMUSCULAR | Status: DC | PRN
  Administered 2022-04-06: 10 mL via PERINEURAL

## 2022-04-06 MED ORDER — CEFAZOLIN SODIUM-DEXTROSE 2-4 GM/100ML-% IV SOLN
2.0000 g | INTRAVENOUS | Status: AC
  Administered 2022-04-06: 2 g via INTRAVENOUS

## 2022-04-06 MED ORDER — ACETAMINOPHEN 500 MG PO TABS
1000.0000 mg | ORAL_TABLET | Freq: Once | ORAL | Status: AC
  Administered 2022-04-06: 1000 mg via ORAL

## 2022-04-06 MED ORDER — FENTANYL CITRATE (PF) 100 MCG/2ML IJ SOLN
INTRAMUSCULAR | Status: AC
  Filled 2022-04-06: qty 2

## 2022-04-06 MED ORDER — VANCOMYCIN HCL 500 MG IV SOLR
INTRAVENOUS | Status: AC
  Filled 2022-04-06: qty 10

## 2022-04-06 MED ORDER — ONDANSETRON HCL 4 MG/2ML IJ SOLN
INTRAMUSCULAR | Status: DC | PRN
  Administered 2022-04-06: 4 mg via INTRAVENOUS

## 2022-04-06 MED ORDER — DEXAMETHASONE SODIUM PHOSPHATE 10 MG/ML IJ SOLN
INTRAMUSCULAR | Status: DC | PRN
  Administered 2022-04-06: 10 mg

## 2022-04-06 MED ORDER — ROPIVACAINE HCL 5 MG/ML IJ SOLN
INTRAMUSCULAR | Status: DC | PRN
  Administered 2022-04-06: 20 mL via PERINEURAL

## 2022-04-06 MED ORDER — ACETAMINOPHEN 500 MG PO TABS
ORAL_TABLET | ORAL | Status: AC
  Filled 2022-04-06: qty 2

## 2022-04-06 MED ORDER — FENTANYL CITRATE (PF) 100 MCG/2ML IJ SOLN
INTRAMUSCULAR | Status: DC | PRN
  Administered 2022-04-06 (×2): 25 ug via INTRAVENOUS

## 2022-04-06 MED ORDER — DOCUSATE SODIUM 100 MG PO CAPS: 100.0000 mg | ORAL_CAPSULE | Freq: Two times a day (BID) | ORAL | 0 refills | Status: AC

## 2022-04-06 MED ORDER — PHENYLEPHRINE 80 MCG/ML (10ML) SYRINGE FOR IV PUSH (FOR BLOOD PRESSURE SUPPORT)
PREFILLED_SYRINGE | INTRAVENOUS | Status: DC | PRN
  Administered 2022-04-06: 160 ug via INTRAVENOUS

## 2022-04-06 MED ORDER — BUPIVACAINE HCL (PF) 0.5 % IJ SOLN
INTRAMUSCULAR | Status: DC | PRN
  Administered 2022-04-06: 20 mL via PERINEURAL

## 2022-04-06 MED ORDER — SENNA 8.6 MG PO TABS: 2.0000 | ORAL_TABLET | Freq: Two times a day (BID) | ORAL | 0 refills | Status: AC

## 2022-04-06 MED ORDER — PROPOFOL 10 MG/ML IV BOLUS
INTRAVENOUS | Status: DC | PRN
  Administered 2022-04-06: 150 mg via INTRAVENOUS

## 2022-04-06 MED ORDER — VANCOMYCIN HCL 500 MG IV SOLR
INTRAVENOUS | Status: DC | PRN
  Administered 2022-04-06: 500 mg via TOPICAL

## 2022-04-06 MED ORDER — DEXAMETHASONE SODIUM PHOSPHATE 10 MG/ML IJ SOLN
INTRAMUSCULAR | Status: DC | PRN
  Administered 2022-04-06: 8 mg via INTRAVENOUS

## 2022-04-06 MED ORDER — LACTATED RINGERS IV SOLN: INTRAVENOUS | Status: DC

## 2022-04-06 MED ORDER — SODIUM CHLORIDE 0.9 % IV SOLN: INTRAVENOUS | Status: DC

## 2022-04-06 MED ORDER — OXYCODONE HCL 5 MG PO TABS: 5.0000 mg | ORAL_TABLET | ORAL | 0 refills | Status: AC | PRN

## 2022-04-06 MED ORDER — MIDAZOLAM HCL 2 MG/2ML IJ SOLN
INTRAMUSCULAR | Status: AC
  Filled 2022-04-06: qty 2

## 2022-04-06 MED ORDER — 0.9 % SODIUM CHLORIDE (POUR BTL) OPTIME
TOPICAL | Status: DC | PRN
  Administered 2022-04-06: 300 mL

## 2022-04-06 MED ORDER — CEFAZOLIN SODIUM-DEXTROSE 2-4 GM/100ML-% IV SOLN
INTRAVENOUS | Status: AC
  Filled 2022-04-06: qty 100

## 2022-04-06 SURGICAL SUPPLY — 112 items
APL PRP STRL LF DISP 70% ISPRP (MISCELLANEOUS) ×1
BANDAGE ESMARK 6X9 LF (GAUZE/BANDAGES/DRESSINGS) IMPLANT
BIT DRILL 1.8 CANN MAX VPC (BIT) IMPLANT
BIT DRILL CAL 2.5 ST W/SLV (BIT) IMPLANT
BLADE AVERAGE 25X9 (BLADE) IMPLANT
BLADE LONG MED 25X9 (BLADE) IMPLANT
BLADE MICRO SAGITTAL (BLADE) IMPLANT
BLADE MINI RND TIP GREEN BEAV (BLADE) ×1 IMPLANT
BLADE OSC/SAG .038X5.5 CUT EDG (BLADE) IMPLANT
BLADE SURG 15 STRL LF DISP TIS (BLADE) ×2 IMPLANT
BLADE SURG 15 STRL SS (BLADE) ×2
BNDG CMPR 6"X 5 YARDS HK CLSR (GAUZE/BANDAGES/DRESSINGS)
BNDG CMPR 75X21 PLY HI ABS (MISCELLANEOUS)
BNDG CMPR 9X4 STRL LF SNTH (GAUZE/BANDAGES/DRESSINGS)
BNDG CMPR 9X6 STRL LF SNTH (GAUZE/BANDAGES/DRESSINGS)
BNDG ELASTIC 4X5.8 VLCR STR LF (GAUZE/BANDAGES/DRESSINGS) ×1 IMPLANT
BNDG ELASTIC 6INX 5YD STR LF (GAUZE/BANDAGES/DRESSINGS) IMPLANT
BNDG ESMARK 4X9 LF (GAUZE/BANDAGES/DRESSINGS) IMPLANT
BNDG ESMARK 6X9 LF (GAUZE/BANDAGES/DRESSINGS)
BNDG GZE 12X3 1 PLY HI ABS (GAUZE/BANDAGES/DRESSINGS) ×1
BNDG STRETCH GAUZE 3IN X12FT (GAUZE/BANDAGES/DRESSINGS) ×1 IMPLANT
BOOT STEPPER DURA LG (SOFTGOODS) IMPLANT
BOOT STEPPER DURA MED (SOFTGOODS) IMPLANT
BOOT STEPPER DURA SM (SOFTGOODS) IMPLANT
BOOT STEPPER DURA XLG (SOFTGOODS) IMPLANT
BUR MIS CONICAL WEDGE 4.3X13 (BUR) IMPLANT
BUR MIS STRT WEDGE 2.9X13 (BURR) IMPLANT
BURR CONICAL 4.3 (BUR)
BURR MIS CONICAL WEDGE 4.3X13 (BUR)
BURR MIS STRT WEDGE 2.9X13 (BURR) ×1
BURR STRAIGHT 2.9X13 SU (BURR) ×1
CAP PIN PROTECTOR ORTHO WHT (CAP) IMPLANT
CHLORAPREP W/TINT 26 (MISCELLANEOUS) ×1 IMPLANT
COVER BACK TABLE 60X90IN (DRAPES) ×1 IMPLANT
CUFF TOURN SGL QUICK 24 (TOURNIQUET CUFF)
CUFF TOURN SGL QUICK 34 (TOURNIQUET CUFF)
CUFF TRNQT CYL 24X4X16.5-23 (TOURNIQUET CUFF) IMPLANT
CUFF TRNQT CYL 34X4.125X (TOURNIQUET CUFF) IMPLANT
DRAPE EXTREMITY T 121X128X90 (DISPOSABLE) ×1 IMPLANT
DRAPE OEC MINIVIEW 54X84 (DRAPES) ×1 IMPLANT
DRAPE U-SHAPE 47X51 STRL (DRAPES) ×1 IMPLANT
DRESSING MEPILEX FLEX 4X4 (GAUZE/BANDAGES/DRESSINGS) IMPLANT
DRSG MEPILEX FLEX 4X4 (GAUZE/BANDAGES/DRESSINGS)
DRSG MEPITEL 4X7.2 (GAUZE/BANDAGES/DRESSINGS) ×1 IMPLANT
ELECT REM PT RETURN 9FT ADLT (ELECTROSURGICAL) ×1
ELECTRODE REM PT RTRN 9FT ADLT (ELECTROSURGICAL) ×1 IMPLANT
GAUZE PAD ABD 8X10 STRL (GAUZE/BANDAGES/DRESSINGS) ×1 IMPLANT
GAUZE SPONGE 4X4 12PLY STRL (GAUZE/BANDAGES/DRESSINGS) ×1 IMPLANT
GAUZE STRETCH 2X75IN STRL (MISCELLANEOUS) IMPLANT
GLOVE BIO SURGEON STRL SZ8 (GLOVE) ×1 IMPLANT
GLOVE BIOGEL PI IND STRL 8 (GLOVE) ×2 IMPLANT
GLOVE ECLIPSE 8.0 STRL XLNG CF (GLOVE) ×1 IMPLANT
GOWN STRL REUS W/ TWL LRG LVL3 (GOWN DISPOSABLE) ×1 IMPLANT
GOWN STRL REUS W/ TWL XL LVL3 (GOWN DISPOSABLE) ×2 IMPLANT
GOWN STRL REUS W/TWL LRG LVL3 (GOWN DISPOSABLE) ×1
GOWN STRL REUS W/TWL XL LVL3 (GOWN DISPOSABLE) ×2
K-WIRE ACE 1.6X6 (WIRE) ×2
K-WIRE COCR 0.9X95 (WIRE) ×1
K-WIRE DBL .054X4 NSTRL (WIRE)
K-WIRE DBL .054X9 NSTRL (WIRE) ×1
K-WIRE TROC 1.25X150 (WIRE) ×1
KIT INSTRUMENT MPJ STRATUM (KITS) IMPLANT
KWIRE ACE 1.6X6 (WIRE) IMPLANT
KWIRE COCR 0.9X95 (WIRE) IMPLANT
KWIRE DBL .054X4 NSTRL (WIRE) IMPLANT
KWIRE DBL .054X9 NSTRL (WIRE) ×1 IMPLANT
KWIRE TROC 1.25X150 (WIRE) IMPLANT
NDL HYPO 22X1.5 SAFETY MO (MISCELLANEOUS) IMPLANT
NDL HYPO 25X1 1.5 SAFETY (NEEDLE) IMPLANT
NEEDLE HYPO 22X1.5 SAFETY MO (MISCELLANEOUS) IMPLANT
NEEDLE HYPO 25X1 1.5 SAFETY (NEEDLE) IMPLANT
NEEDLE SAFETY HYPO 22GAX1.5 (MISCELLANEOUS)
NS IRRIG 1000ML POUR BTL (IV SOLUTION) ×1 IMPLANT
PACK BASIN DAY SURGERY FS (CUSTOM PROCEDURE TRAY) ×1 IMPLANT
PAD CAST 4YDX4 CTTN HI CHSV (CAST SUPPLIES) ×1 IMPLANT
PADDING CAST ABS COTTON 4X4 ST (CAST SUPPLIES) IMPLANT
PADDING CAST COTTON 4X4 STRL (CAST SUPPLIES) ×1
PADDING CAST COTTON 6X4 STRL (CAST SUPPLIES) IMPLANT
PASSER SUT SWANSON 36MM LOOP (INSTRUMENTS) IMPLANT
PENCIL SMOKE EVACUATOR (MISCELLANEOUS) ×1 IMPLANT
PLATE MPJ 1ST STRM STD 7D RT (Plate) IMPLANT
SANITIZER HAND PURELL FF 515ML (MISCELLANEOUS) ×1 IMPLANT
SCREW COMP HEADLESS 2.5X18 (Screw) IMPLANT
SCREW HCS TWIST-OFF 2.0X12MM (Screw) IMPLANT
SCREW LOCK STRATUM 3.5X16 (Screw) IMPLANT
SCREW LOCK STRATUM 3.5X18 (Screw) IMPLANT
SCREW MAX VPC  2.5X20 (Screw) ×2 IMPLANT
SCREW MAX VPC 2.5X20 (Screw) IMPLANT
SCREW NL LP STRATUM 3.5X16 (Screw) IMPLANT
SCREW STRM NL LP 3.5X20 (Screw) IMPLANT
SET IRRIGATION TUBING (TUBING) IMPLANT
SHEET MEDIUM DRAPE 40X70 STRL (DRAPES) ×1 IMPLANT
SLEEVE SCD COMPRESS KNEE MED (STOCKING) ×1 IMPLANT
SPLINT PLASTER CAST FAST 5X30 (CAST SUPPLIES) IMPLANT
SPONGE SURGIFOAM ABS GEL 12-7 (HEMOSTASIS) IMPLANT
SPONGE T-LAP 18X18 ~~LOC~~+RFID (SPONGE) ×1 IMPLANT
STOCKINETTE 6  STRL (DRAPES) ×1
STOCKINETTE 6 STRL (DRAPES) ×1 IMPLANT
SUCTION FRAZIER HANDLE 10FR (MISCELLANEOUS) ×1
SUCTION TUBE FRAZIER 10FR DISP (MISCELLANEOUS) ×1 IMPLANT
SUT ETHILON 3 0 PS 1 (SUTURE) ×1 IMPLANT
SUT MNCRL AB 3-0 PS2 18 (SUTURE) ×1 IMPLANT
SUT VIC AB 2-0 SH 27 (SUTURE) ×1
SUT VIC AB 2-0 SH 27XBRD (SUTURE) ×1 IMPLANT
SUT VICRYL 0 SH 27 (SUTURE) IMPLANT
SUT VICRYL 0 UR6 27IN ABS (SUTURE) IMPLANT
SYR BULB EAR ULCER 3OZ GRN STR (SYRINGE) ×1 IMPLANT
SYR CONTROL 10ML LL (SYRINGE) IMPLANT
TOWEL GREEN STERILE FF (TOWEL DISPOSABLE) ×2 IMPLANT
TUBE CONNECTING 20X1/4 (TUBING) ×1 IMPLANT
UNDERPAD 30X36 HEAVY ABSORB (UNDERPADS AND DIAPERS) ×1 IMPLANT
YANKAUER SUCT BULB TIP NO VENT (SUCTIONS) IMPLANT

## 2022-04-06 NOTE — Transfer of Care (Signed)
Immediate Anesthesia Transfer of Care Note  Patient: Tracy Little  Procedure(s) Performed: ARTHRODESIS METATARSALPHALANGEAL JOINT (MTPJ), HALLUX (Right: Toe) HAMMERTOE RECONSTRUCTION WITH WEIL OSTEOTOMY, 2-4 METATARSALS (Right: Toe) EXCISION OF RIGHT 3RD TOE MASS (Right: Toe) EXCISION OF TAILOR'S BUNION (Right: Toe)  Patient Location: PACU  Anesthesia Type:GA combined with regional for post-op pain  Level of Consciousness: sedated  Airway & Oxygen Therapy: Patient Spontanous Breathing and Patient connected to face mask oxygen  Post-op Assessment: Report given to RN and Post -op Vital signs reviewed and stable  Post vital signs: Reviewed and stable  Last Vitals:  Vitals Value Taken Time  BP 108/67 04/06/22 1217  Temp    Pulse 72 04/06/22 1219  Resp 10 04/06/22 1219  SpO2 98 % 04/06/22 1219  Vitals shown include unvalidated device data.  Last Pain:  Vitals:   04/06/22 0917  TempSrc: Oral  PainSc: 0-No pain         Complications: No notable events documented.

## 2022-04-06 NOTE — H&P (Signed)
ELYSABETH Little is an 67 y.o. female.   Chief Complaint: right foot pain HPI: 67 y/o female without significant PMH c/o worsening R foot pain over the last year +.  She has hallux rigidus, metatarsalgia, 2-4 hammertoes and a bunionette.  She has failed non op treatment including activity modification, shoewear modification and oral NSAIDs.  She presents today for surgery.  Past Medical History:  Diagnosis Date   Arthritis    Cancer ([place])    skin   Hypertension    pt denies    Past Surgical History:  Procedure Laterality Date   CHOLECYSTECTOMY N/A 12/23/2020   Procedure: LAPAROSCOPIC CHOLECYSTECTOMY WITH INTRAOPERATIVE CHOLANGIOGRAM;  Surgeon: Dwan Bolt, MD;  Location: [facility];  Service: General;  Laterality: N/A;   ENDOSCOPIC RETROGRADE CHOLANGIOPANCREATOGRAPHY (ERCP) WITH PROPOFOL N/A 12/25/2020   Procedure: ENDOSCOPIC RETROGRADE CHOLANGIOPANCREATOGRAPHY (ERCP) WITH PROPOFOL;  Surgeon: Clarene Essex, MD;  Location: [facility];  Service: Endoscopy;  Laterality: N/A;   REMOVAL OF STONES  12/25/2020   Procedure: REMOVAL OF STONES;  Surgeon: Clarene Essex, MD;  Location: [facility];  Service: Endoscopy;;   SPHINCTEROTOMY  12/25/2020   Procedure: Joan Mayans;  Surgeon: Clarene Essex, MD;  Location: [facility];  Service: Endoscopy;;   TONSILLECTOMY     TUBAL LIGATION      No family history on file. Social History:  reports that she has never smoked. She has never used smokeless tobacco. She reports that she does not drink alcohol and does not use drugs.  Allergies: No Known Allergies  Medications Prior to Admission  Medication Sig Dispense Refill   chlorthalidone (HYGROTON) 25 MG tablet Take 25 mg by mouth daily. Patient unsure why she is precribed     metoprolol succinate (TOPROL-XL) 50 MG 24 hr tablet Take 50 mg by mouth daily. Take with or immediately following a meal.     traMADol (ULTRAM) 50 MG tablet Take 1-2 tablets (50-100 mg total) by mouth every 6 (six) hours as  needed for moderate pain or severe pain. 20 tablet 0    No results found for this or any previous visit (from the past 48 hour(s)). No results found.  Review of Systems  no recent f/c/n/v/wt loss  Blood pressure 116/79, height 5' 7"$  (1.702 m), weight 99.8 kg, last menstrual period 08/09/2014. Physical Exam  Wn wd woman in nad.  A and o X 4.  Normal mood and affect.  EOMI.  Resp unlabored.  R forefoot with 2-4 hammertoes.  Skin heatlhy.  Decreased ROM at the hallux MPJ.  Pulses are palpable in the foot.  Assessment/Plan R hallux rigidus, metatarsalgia, 2-4 hammertoes and bunionette.  The risks and benefits of the alternative treatment options have been discussed in detail.  The patient wishes to proceed with surgery and specifically understands risks of bleeding, infection, nerve damage, blood clots, need for additional surgery, amputation and death.   Wylene Simmer, MD May 03, 2022, 9:00 AM

## 2022-04-06 NOTE — Progress Notes (Signed)
Assisted Dr. Lanetta Inch with right, popliteal/saphenous block. Side rails up, monitors on throughout procedure. See vital signs in flow sheet. Tolerated Procedure well.

## 2022-04-06 NOTE — Anesthesia Procedure Notes (Signed)
Anesthesia Regional Block: Popliteal block   Pre-Anesthetic Checklist: , timeout performed,  Correct Patient, Correct Site, Correct Laterality,  Correct Procedure, Correct Position, site marked,  Risks and benefits discussed,  Pre-op evaluation,  At surgeon's request and post-op pain management  Laterality: Right  Prep: Maximum Sterile Barrier Precautions used, chloraprep       Needles:  Injection technique: Single-shot  Needle Type: Echogenic Stimulator Needle     Needle Length: 9cm  Needle Gauge: 21     Additional Needles:   Procedures:,,,, ultrasound used (permanent image in chart),,    Narrative:  Start time: 04/06/2022 9:35 AM End time: 04/06/2022 9:40 AM Injection made incrementally with aspirations every 5 mL. Anesthesiologist: Freddrick March, MD

## 2022-04-06 NOTE — Anesthesia Procedure Notes (Signed)
Procedure Name: LMA Insertion Date/Time: 04/06/2022 10:27 AM  Performed by: Tawni Millers, CRNAPre-anesthesia Checklist: Patient identified, Emergency Drugs available, Suction available and Patient being monitored Patient Re-evaluated:Patient Re-evaluated prior to induction Oxygen Delivery Method: Circle system utilized Preoxygenation: Pre-oxygenation with 100% oxygen Induction Type: IV induction Ventilation: Mask ventilation without difficulty LMA: LMA inserted LMA Size: 4.0 Number of attempts: 1 Airway Equipment and Method: Bite block Placement Confirmation: positive ETCO2 Tube secured with: Tape Dental Injury: Teeth and Oropharynx as per pre-operative assessment

## 2022-04-06 NOTE — Anesthesia Procedure Notes (Signed)
Anesthesia Regional Block: Adductor canal block   Pre-Anesthetic Checklist: , timeout performed,  Correct Patient, Correct Site, Correct Laterality,  Correct Procedure, Correct Position, site marked,  Risks and benefits discussed,  Pre-op evaluation,  At surgeon's request and post-op pain management  Laterality: Right  Prep: Maximum Sterile Barrier Precautions used, chloraprep       Needles:  Injection technique: Single-shot  Needle Type: Echogenic Stimulator Needle     Needle Length: 9cm  Needle Gauge: 21     Additional Needles:   Procedures:,,,, ultrasound used (permanent image in chart),,    Narrative:  Start time: 04/06/2022 9:40 AM End time: 04/06/2022 9:44 AM Injection made incrementally with aspirations every 5 mL. Anesthesiologist: Freddrick March, MD

## 2022-04-06 NOTE — Anesthesia Preprocedure Evaluation (Addendum)
Anesthesia Evaluation  Patient identified by MRN, date of birth, ID band Patient awake    Reviewed: Allergy & Precautions, NPO status , Patient's Chart, lab work & pertinent test results  Airway Mallampati: II  TM Distance: >3 FB Neck ROM: Full    Dental no notable dental hx. (+) Teeth Intact, Dental Advisory Given   Pulmonary neg pulmonary ROS   Pulmonary exam normal breath sounds clear to auscultation       Cardiovascular hypertension, Pt. on medications and Pt. on home beta blockers Normal cardiovascular exam Rhythm:Regular Rate:Normal     Neuro/Psych negative neurological ROS  negative psych ROS   GI/Hepatic negative GI ROS, Neg liver ROS,,,  Endo/Other  negative endocrine ROS    Renal/GU negative Renal ROS  negative genitourinary   Musculoskeletal  (+) Arthritis ,    Abdominal   Peds  Hematology negative hematology ROS (+)   Anesthesia Other Findings   Reproductive/Obstetrics                             Anesthesia Physical Anesthesia Plan  ASA: 2  Anesthesia Plan: General and Regional   Post-op Pain Management: Regional block* and Tylenol PO (pre-op)*   Induction: Intravenous  PONV Risk Score and Plan: 3 and Ondansetron, Dexamethasone and Midazolam  Airway Management Planned: LMA  Additional Equipment:   Intra-op Plan:   Post-operative Plan: Extubation in OR  Informed Consent: I have reviewed the patients History and Physical, chart, labs and discussed the procedure including the risks, benefits and alternatives for the proposed anesthesia with the patient or authorized representative who has indicated his/her understanding and acceptance.     Dental advisory given  Plan Discussed with: CRNA  Anesthesia Plan Comments:        Anesthesia Quick Evaluation

## 2022-04-06 NOTE — Op Note (Signed)
04/06/2022  12:21 PM  PATIENT:  Tracy Little  67 y.o. female  PRE-OPERATIVE DIAGNOSIS:  1.  Right foot bunion deformity and hallux rigidus     2.  Right foot metatarsalgia     3.  Right 2nd, 3rd and 4th hammertoes     4.  Right foot tailor's bunion     5.  Right foot 3rd toe mass  POST-OPERATIVE DIAGNOSIS:  same  Procedure(s): Right hallux MPJ arthrodesis Right 2nd and 3rd MT Weil osteotomies Right 2nd and 3rd MTP joint dorsal capsulotomy and extensor tendon lengthening Right 4th toe extensor tenotomy Right 3rd toe dorsal mass excision Right 5th MT osteotomy and tailor's bunion excision Right 3rd and 4th hammertoe corrections Right foot AP, lateral and oblique osteotomies  SURGEON:  Wylene Simmer, MD  ASSISTANT: Mechele Claude, PA-C  ANESTHESIA:   General, regional  EBL:  minimal   TOURNIQUET:   Total Tourniquet Time Documented: Thigh (Right) - 103 minutes Total: Thigh (Right) - [id number] minutes  COMPLICATIONS:  None apparent  DISPOSITION:  Extubated, awake and stable to recovery.  INDICATION FOR PROCEDURE:  67 y/o female has a long h/o worsening right forefoot pain due to a severe bunion, metatarsalgia, hammertoes and a tailor's bunion.  She has failed non op treatment and presents today for surgical correction of these painful deformities.  The risks and benefits of the alternative treatment options have been discussed in detail.  The patient wishes to proceed with surgery and specifically understands risks of bleeding, infection, nerve damage, blood clots, need for additional surgery, amputation and death.   PROCEDURE IN DETAIL:  After pre operative consent was obtained, and the correct operative site was identified, the patient was brought to the operating room and placed supine on the OR table.  Anesthesia was administered.  Pre-operative antibiotics were administered.  A surgical timeout was taken.  The right lower extremity was prepped and draped in standard sterile  fashion with a tourniquet around the thigh.  The extremity was elevated, and the tourniquet was inflated to 250 mmHg.  Incision was then made dorsally over the hallux MP joint.  Dissection was carried sharply down through the subcutaneous tissues.  The extensor tendons were protected.  The collateral ligaments were released exposing the metatarsal head.  A concave reamer was used to remove the right Articular cartilage and subchondral bone.  The base of the proximal phalanx was exposed.  A convex reamer was used to remove the remaining articular cartilage and subchondral bone.  Wound was irrigated copiously.  A small drill bit was used to perforate both sides of the joint leaving the resultant bone graft in place.  The joint was reduced and provisionally pinned.  This corrected the hallux valgus and intermetatarsal angles.  The template for the stratum 7 degree fusion plate was selected.  It was placed over the dorsum of the joint and provisionally pinned.  Radiographs confirmed appropriate position of the plate.  The distal tine holes were drilled.  The template was removed.  The plate was inserted distally and tamped into place.  It was then pulled down to bone with a nonlocking bicortical screw.  The second hole was drilled and filled with a locking bicortical screw.  The previous nonlocking screw was swapped out for a locking bicortical screw.  Proximally the compression slot was drilled and the compression pin inserted.  The compression knot was tightened compressing the joint appropriately.  The distal hole in the proximal limb of the plate was  then drilled and filled with a bicortical screw.  The remaining drill holes were filled with bicortical locking screws.  The compression pin was removed and replaced with a bicortical screw.  AP and lateral radiographs confirmed appropriate reduction of the intermetatarsal and hallux valgus angles and appropriate arthrodesis of the hallux MP joint.  The wound was  irrigated copiously and sprinkled with vancomycin powder.  Subcutaneous tissues were closed with Monocryl and the skin incision was closed with nylon.  Attention was turned to the second webspace.  A longitudinal incision was made.  Dissection was carried down through the subcutaneous tissues to the second MTP joint.  The extensor tendons were lengthened and the dorsal joint capsule excised.  The metatarsal head was exposed.  A Weil osteotomy was made with a small bur.  The head of the metatarsal was allowed to retract proximally and was fixed with a 2 mm Zimmer [employer] FRS screw.  Overhanging bone was trimmed with a rondure.  Attention was turned to the third MTP joint.  The extensor tendons were lengthened and the dorsal joint capsule excised.  A Weil osteotomy was made using the same technique.  Overhanging bone was trimmed.  Radiographs confirmed appropriate shortening of the second and third metatarsals and appropriate position and length of both screws.  The third toe dorsal mass was identified.  An incision was made over the mass and dissection carried down through the subcutaneous tissues.  The mass was well encapsulated and did not appear to be invading or adherent to any adjacent soft tissue.  It did not appear to be fluid-filled.  Blunt dissection was then carried circumferentially around the mass.  It was passed off the field as a specimen to pathology.  Attention was returned to the second toe.  An incision was made dorsally over the PIP joint.  The bur was inserted into the joint and used to remove the remaining articular cartilage and subchondral bone from both sides of the joint.  The joint was then reduced and fixed with a 2.5 mm Zimmer Biomet VPC screw.  Radiographs confirmed appropriate reduction of the PIP joint in appropriate position and length of the screw.  The same procedure was then performed for the third toe.  The fourth toe extensor tendon was noted to be contracted.  A  percutaneous tendon release was performed for the extensor digitorum longus tendon using a Beaver blade.  Attention was turned to the lateral forefoot where the tailor's bunion was identified.  An incision was made adjacent to the metaphyseal bone.  The periosteum was elevated plantarly and dorsally with a small elevator.  The MIS bur was used to remove the hypertrophic lateral eminence under fluoroscopic guidance.  The bur was then used to create an oblique osteotomy at the metatarsal neck.  The 4-5 intermetatarsal angle was corrected.  A guidepin was then inserted adjacent to the fifth metatarsal head and into the canal of the proximal fifth metatarsal.  Radiographs confirmed appropriate position of the guidepin and appropriate correction of the IM angle.  The pin was bent and trimmed.  Final AP, lateral and oblique radiographs confirmed appropriate correction of the forefoot deformities and appropriate position and length of all hardware.  The wounds were irrigated copiously and sprinkled with vancomycin powder.  Subcutaneous tissues were approximated with Monocryl.  Skin incisions were closed with 3-0 nylon.  Sterile dressings were applied followed by compression wrap and a cam boot.  Tourniquet was released after application of dressings.  The patient  was awakened from anesthesia and transported to the recovery room in stable condition.   FOLLOW UP PLAN: Weightbearing on the heel in the cam boot.  Follow-up in 2 weeks for suture removal.  Will consider removing the pin from the lateral forefoot at that time.  Continue taping the central toes down postoperatively for 6 weeks.  Plan 6 weeks postoperative weightbearing immobilization in the cam boot.   RADIOGRAPHS: AP, lateral and oblique radiographs of the left foot are obtained intraoperatively.  These show interval arthrodesis of the hallux MP joint and shortening of the second and third metatarsals.  Hammertoe corrections are noted at the second and  third toes.  Fifth metatarsal osteotomy is appropriately aligned.  All hardware is appropriately positioned.    Mechele Claude PA-C was present and scrubbed for the duration of the operative case. His assistance was essential in positioning the patient, prepping and draping, gaining and maintaining exposure, performing the operation, closing and dressing the wounds and applying the splint.

## 2022-04-06 NOTE — Discharge Instructions (Addendum)
Wylene Simmer, MD [facility]  Please read the following information regarding your care after surgery.  Medications  You only need a prescription for the narcotic pain medicine (ex. oxycodone, Percocet, Norco).  All of the other medicines listed below are available over the counter. ? Aleve 2 pills twice a day for the first 3 days after surgery. ? acetominophen (Tylenol) 650 mg every 4-6 hours as you need for minor to moderate pain (Next dose of Tylenol not due until after 3:20 today if needed) ? oxycodone as prescribed for severe pain  Narcotic pain medicine (ex. oxycodone, Percocet, Vicodin) will cause constipation.  To prevent this problem, take the following medicines while you are taking any pain medicine. ? docusate sodium (Colace) 100 mg twice a day ? senna (Senokot) 2 tablets twice a day   Weight Bearing ? Bear weight only on your operated foot in the CAM boot.   Cast / Splint / Dressing ? Keep your splint, cast or dressing clean and dry.  Don't put anything (coat hanger, pencil, etc) down inside of it.  If it gets damp, use a hair dryer on the cool setting to dry it.  If it gets soaked, call the office to schedule an appointment for a cast change.   After your dressing, cast or splint is removed; you may shower, but do not soak or scrub the wound.  Allow the water to run over it, and then gently pat it dry.  Swelling It is normal for you to have swelling where you had surgery.  To reduce swelling and pain, keep your toes above your nose for at least 3 days after surgery.  It may be necessary to keep your foot or leg elevated for several weeks.  If it hurts, it should be elevated.  Follow Up Call my office at [phone] when you are discharged from the hospital or surgery center to schedule an appointment to be seen two weeks after surgery.  Call my office at [phone] if you develop a fever >101.5 F, nausea, vomiting, bleeding from the surgical site or severe pain.      Post Anesthesia Home Care Instructions  Activity: Get plenty of rest for the remainder of the day. A responsible individual must stay with you for 24 hours following the procedure.  For the next 24 hours, DO NOT: -Drive a car -Paediatric nurse -Drink alcoholic beverages -Take any medication unless instructed by your physician -Make any legal decisions or sign important papers.  Meals: Start with liquid foods such as gelatin or soup. Progress to regular foods as tolerated. Avoid greasy, spicy, heavy foods. If nausea and/or vomiting occur, drink only clear liquids until the nausea and/or vomiting subsides. Call your physician if vomiting continues.  Special Instructions/Symptoms: Your throat may feel dry or sore from the anesthesia or the breathing tube placed in your throat during surgery. If this causes discomfort, gargle with warm salt water. The discomfort should disappear within 24 hours.  If you had a scopolamine patch placed behind your ear for the management of post- operative nausea and/or vomiting:  1. The medication in the patch is effective for 72 hours, after which it should be removed.  Wrap patch in a tissue and discard in the trash. Wash hands thoroughly with soap and water. 2. You may remove the patch earlier than 72 hours if you experience unpleasant side effects which may include dry mouth, dizziness or visual disturbances. 3. Avoid touching the patch. Wash your hands with soap and water  after contact with the patch.  Regional Anesthesia Blocks  1. Numbness or the inability to move the "blocked" extremity may last from 3-48 hours after placement. The length of time depends on the medication injected and your individual response to the medication. If the numbness is not going away after 48 hours, call your surgeon.  2. The extremity that is blocked will need to be protected until the numbness is gone and the  Strength has returned. Because you cannot feel it, you  will need to take extra care to avoid injury. Because it may be weak, you may have difficulty moving it or using it. You may not know what position it is in without looking at it while the block is in effect.  3. For blocks in the legs and feet, returning to weight bearing and walking needs to be done carefully. You will need to wait until the numbness is entirely gone and the strength has returned. You should be able to move your leg and foot normally before you try and bear weight or walk. You will need someone to be with you when you first try to ensure you do not fall and possibly risk injury.  4. Bruising and tenderness at the needle site are common side effects and will resolve in a few days.  5. Persistent numbness or new problems with movement should be communicated to the surgeon or the [facility] [phone] [place] [phone]).Information for Discharge Teaching:   EXPAREL (bupivacaine liposome injectable suspension)   Your surgeon or anesthesiologist gave you EXPAREL(bupivacaine) to help control your pain after surgery.  EXPAREL is a local anesthetic that provides pain relief by numbing the tissue around the surgical site. EXPAREL is designed to release pain medication over time and can control pain for up to 72 hours. Depending on how you respond to EXPAREL, you may require less pain medication during your recovery.  Possible side effects: Temporary loss of sensation or ability to move in the area where bupivacaine was injected. Nausea, vomiting, constipation Rarely, numbness and tingling in your mouth or lips, lightheadedness, or anxiety may occur. Call your doctor right away if you think you may be experiencing any of these sensations, or if you have other questions regarding possible side effects.  Follow all other discharge instructions given to you by your surgeon or nurse. Eat a healthy diet and drink plenty of water or other fluids.  If  you return to the hospital for any reason within 96 hours following the administration of EXPAREL, it is important for health care providers to know that you have received this anesthetic. A teal colored band has been placed on your arm with the date, time and amount of EXPAREL you have received in order to alert and inform your health care providers. Please leave this armband in place for the full 96 hours following administration, and then you may remove the band.

## 2022-04-06 NOTE — Anesthesia Postprocedure Evaluation (Signed)
Anesthesia Post Note  Patient: Tracy Little  Procedure(s) Performed: ARTHRODESIS METATARSALPHALANGEAL JOINT (MTPJ), HALLUX (Right: Toe) HAMMERTOE RECONSTRUCTION WITH WEIL OSTEOTOMY, 2-4 METATARSALS (Right: Toe) EXCISION OF RIGHT 3RD TOE MASS (Right: Toe) EXCISION OF TAILOR'S BUNION (Right: Toe)     Patient location during evaluation: PACU Anesthesia Type: Regional and General Level of consciousness: awake and alert Pain management: pain level controlled Vital Signs Assessment: post-procedure vital signs reviewed and stable Respiratory status: spontaneous breathing, nonlabored ventilation, respiratory function stable and patient connected to nasal cannula oxygen Cardiovascular status: blood pressure returned to baseline and stable Postop Assessment: no apparent nausea or vomiting Anesthetic complications: no  No notable events documented.  Last Vitals:  Vitals:   04/06/22 1231 04/06/22 1321  BP:  122/78  Pulse: 86 78  Resp: 15 20  Temp:  (!) 36.4 C  SpO2: 97% 95%    Last Pain:  Vitals:   04/06/22 1321  TempSrc: Rectal  PainSc: 0-No pain                 Tracy Little

## 2022-04-07 ENCOUNTER — Other Ambulatory Visit: Payer: Self-pay

## 2022-04-07 ENCOUNTER — Encounter (HOSPITAL_BASED_OUTPATIENT_CLINIC_OR_DEPARTMENT_OTHER): Payer: Self-pay | Admitting: Orthopedic Surgery

## 2022-04-07 LAB — SURGICAL PATHOLOGY

## 2023-03-04 IMAGING — CR DG CHEST 2V
2 series · 2 of 2 positions shown · non-contrast
Comparison: 08/09/2015

CLINICAL DATA: Chest pain with nausea and vomiting for several days

EXAM:
CHEST - 2 VIEW

[chest pa]
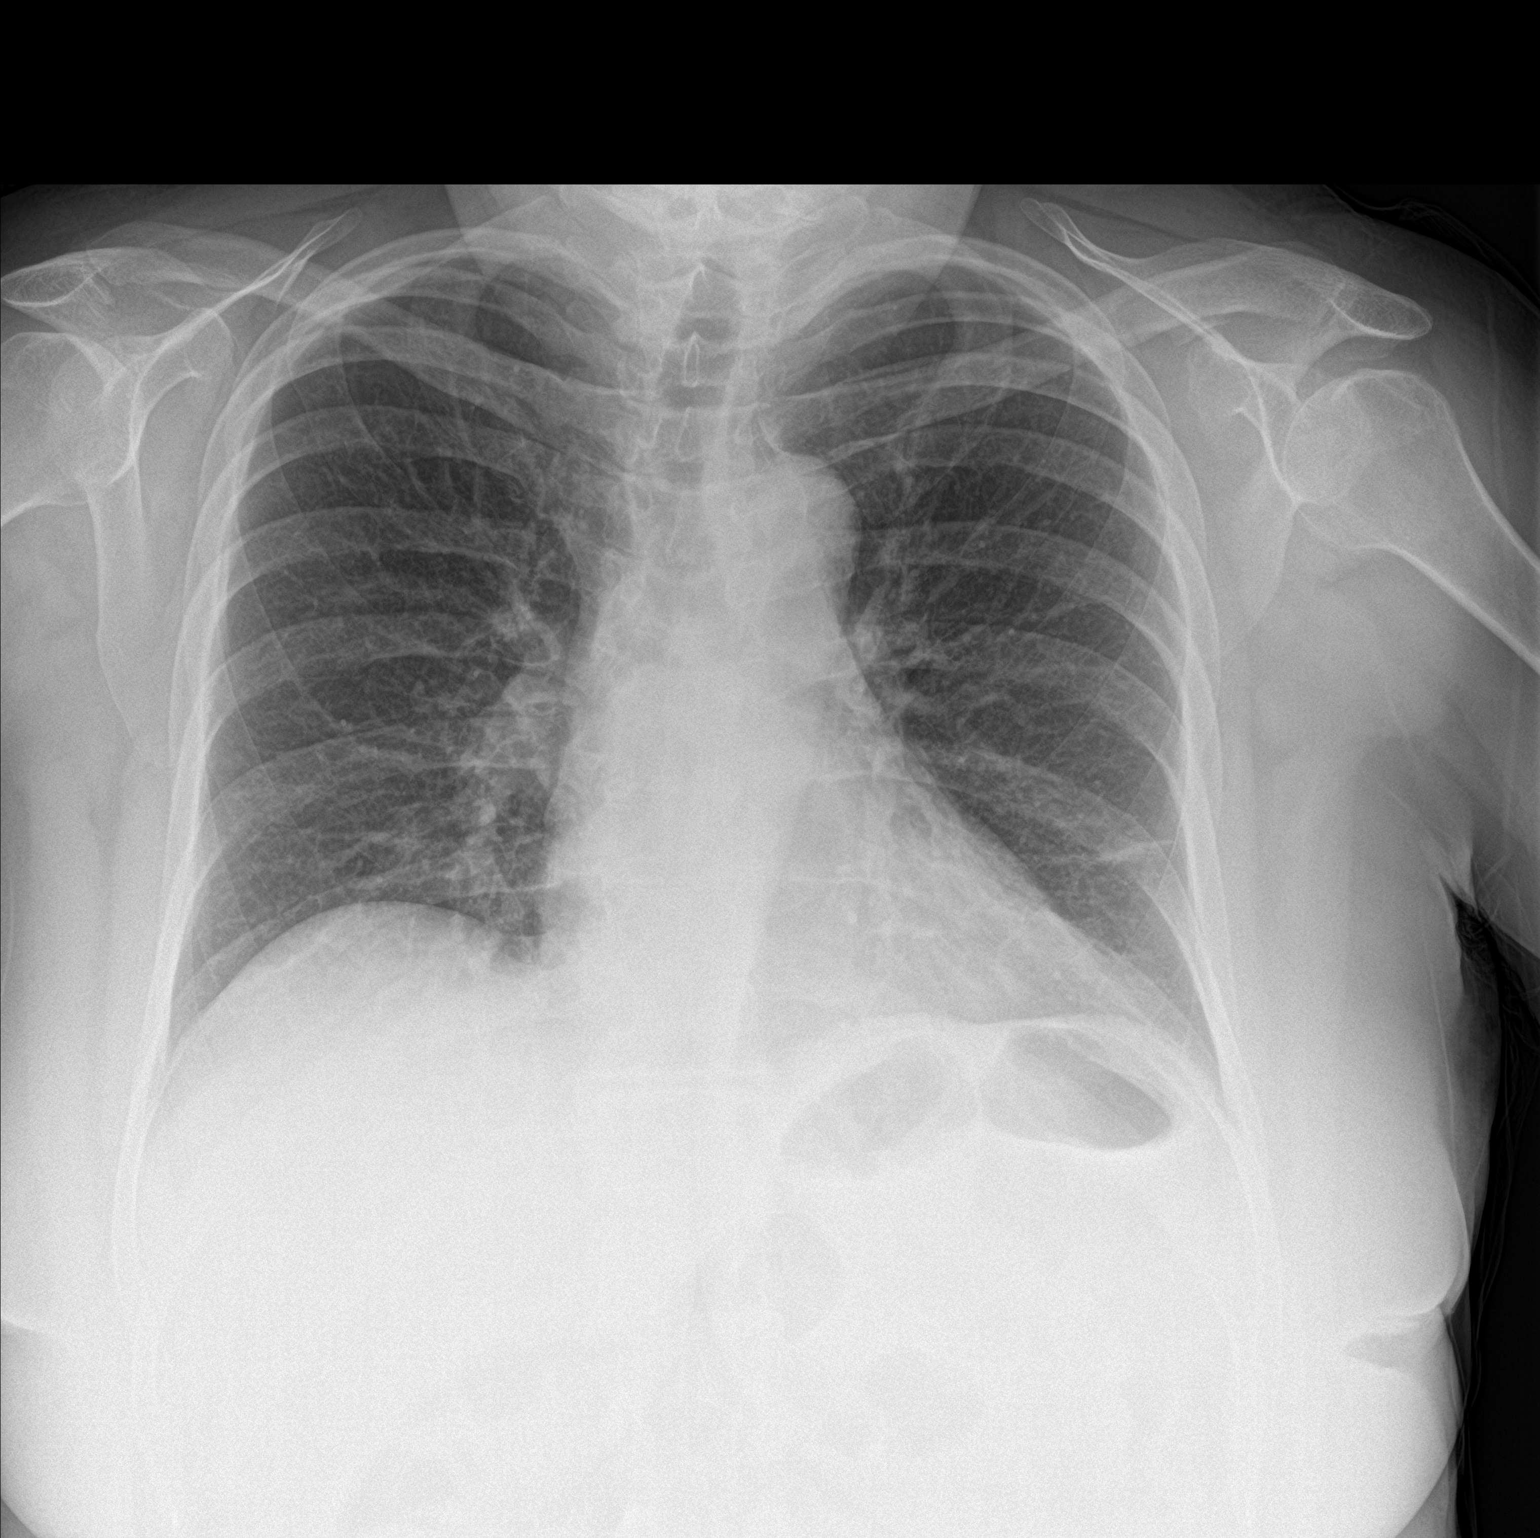

[chest lat]
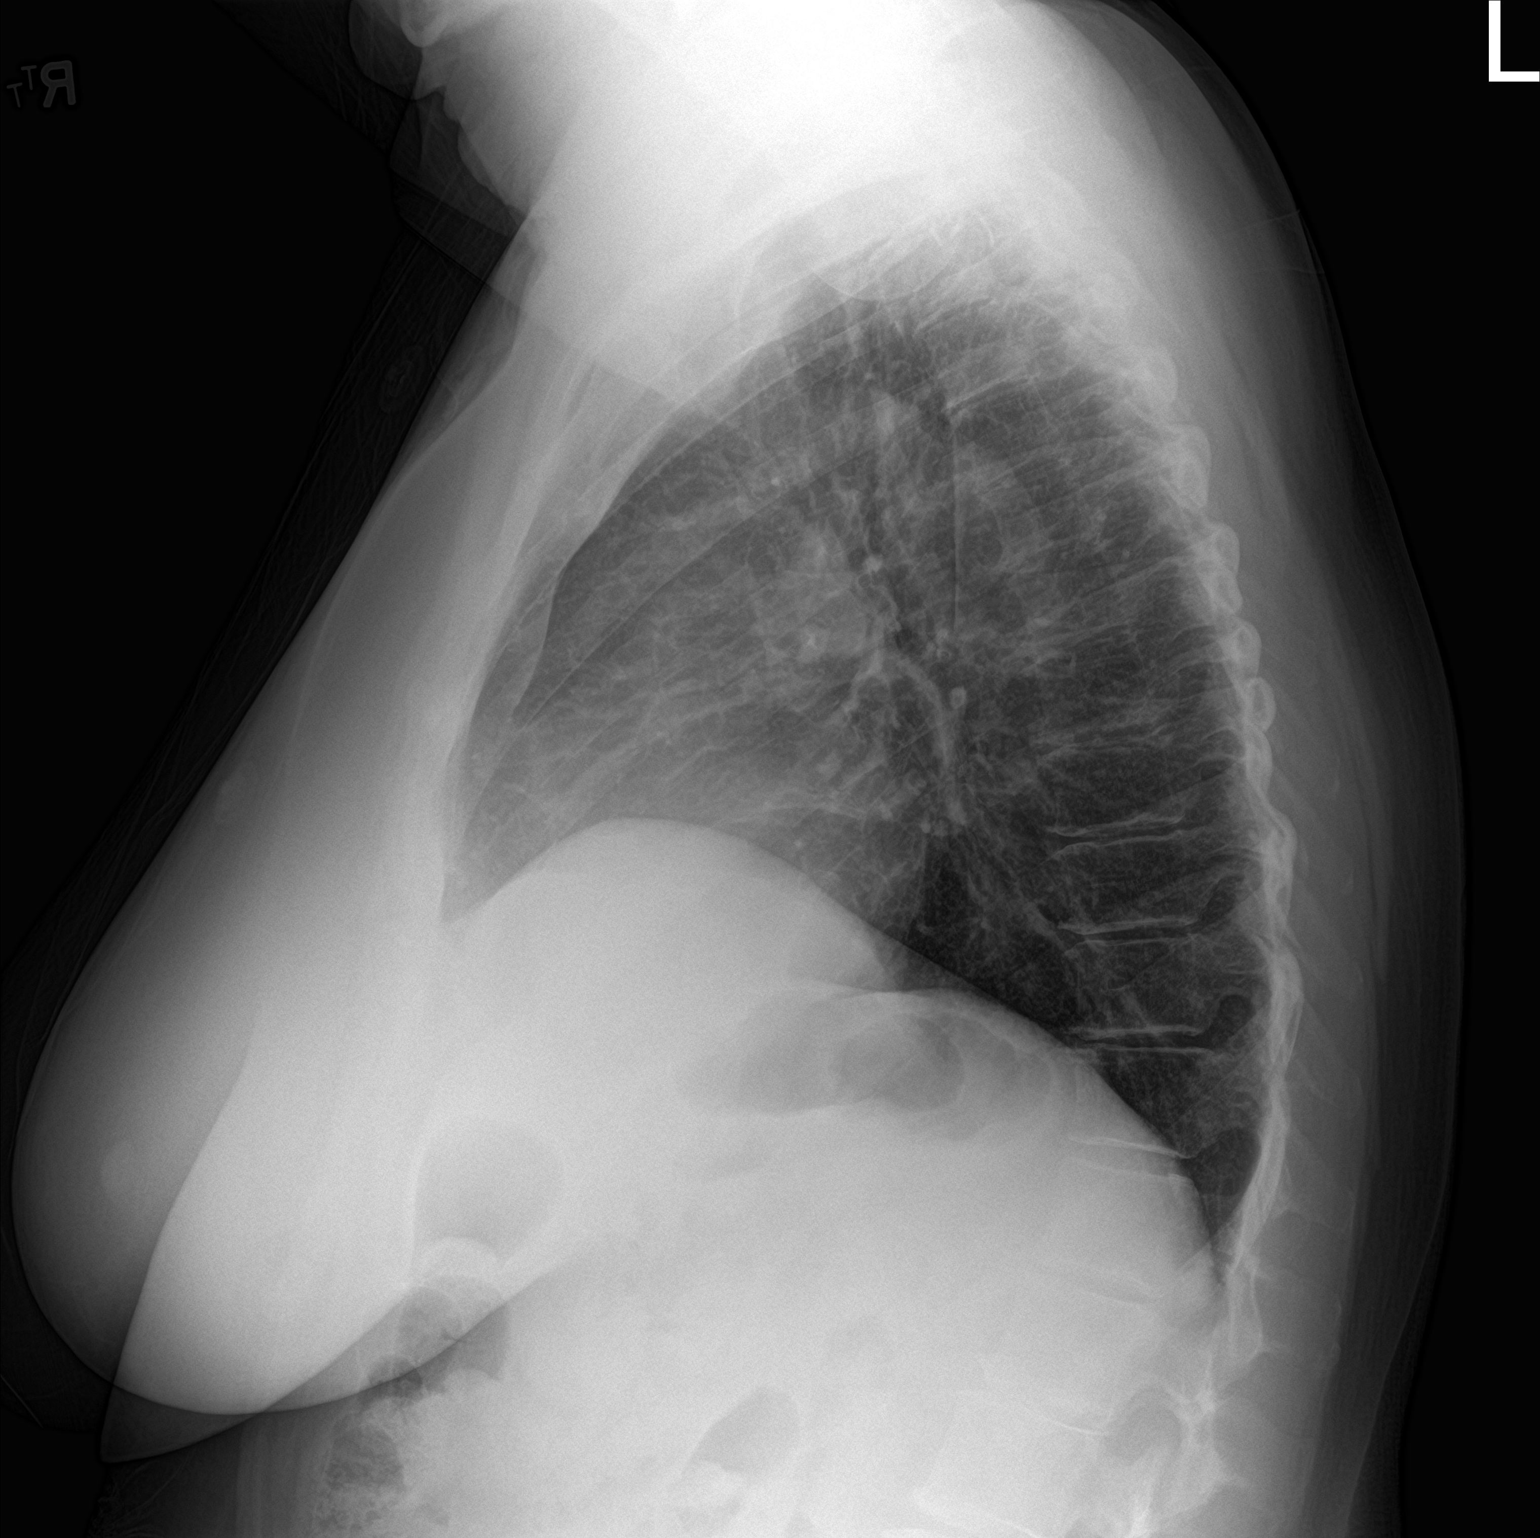

[2 of 2 positions shown; findings below may reference images not displayed]

FINDINGS: The heart size and mediastinal contours are within normal limits.
Both lungs are clear. Minimal scarring is noted in the left base.
The visualized skeletal structures are unremarkable.
IMPRESSION: No active cardiopulmonary disease.

## 2023-03-05 IMAGING — US US ABDOMEN COMPLETE
1 series · 13 of 25 positions shown · non-contrast
Comparison: None.

CLINICAL DATA: look at gallbladder, liver, appendix, aorta

EXAM:
ABDOMEN ULTRASOUND COMPLETE

[Series 1: us abdomen complete · 13 of 97 slices shown]
[im 1/97]
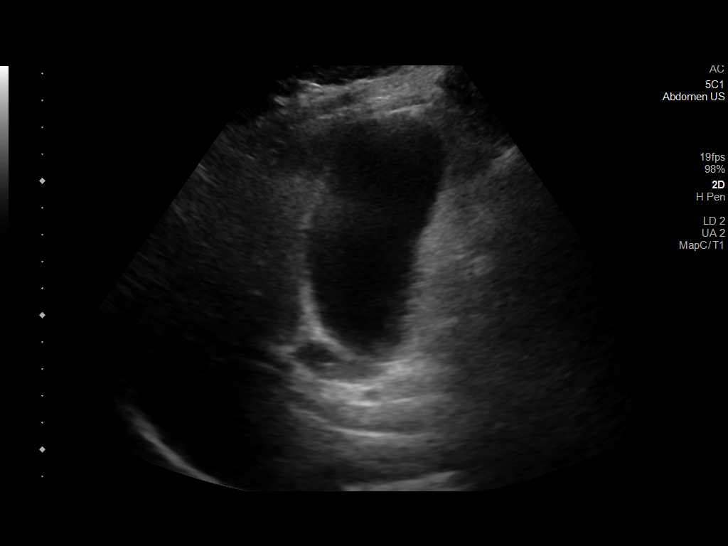
[im 9/97]
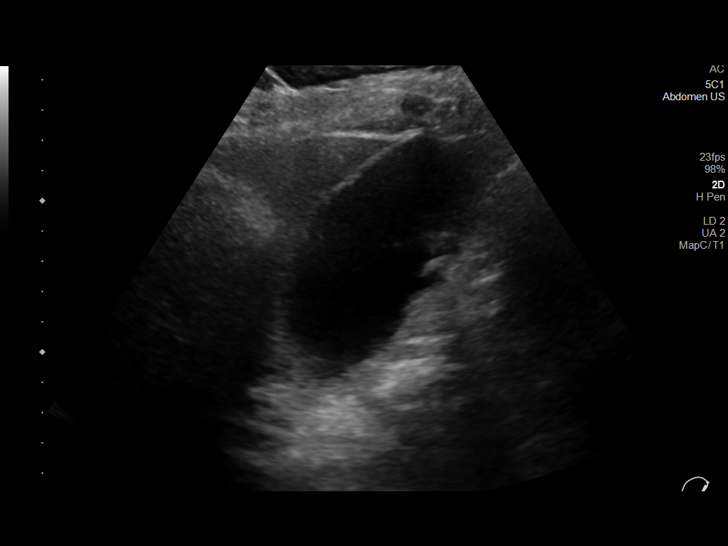
[im 17/97]
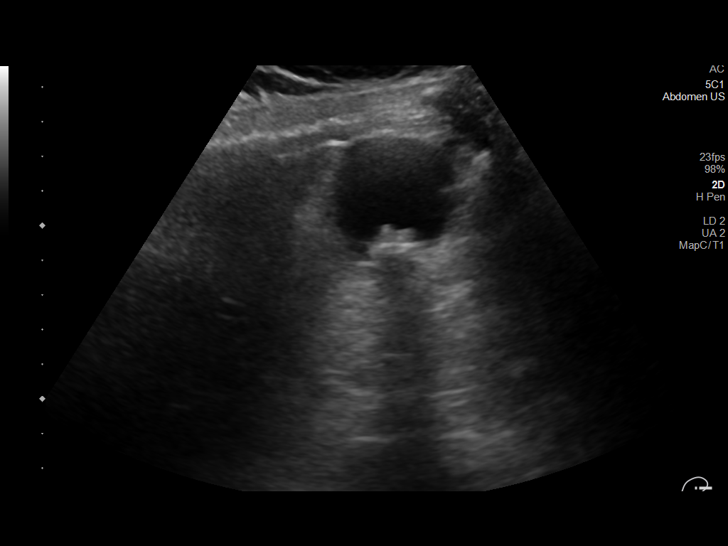
[im 25/97]
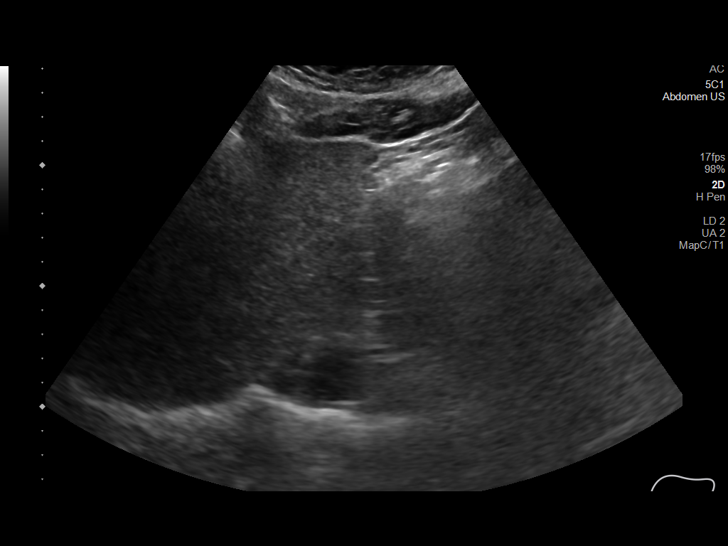
[im 33/97]
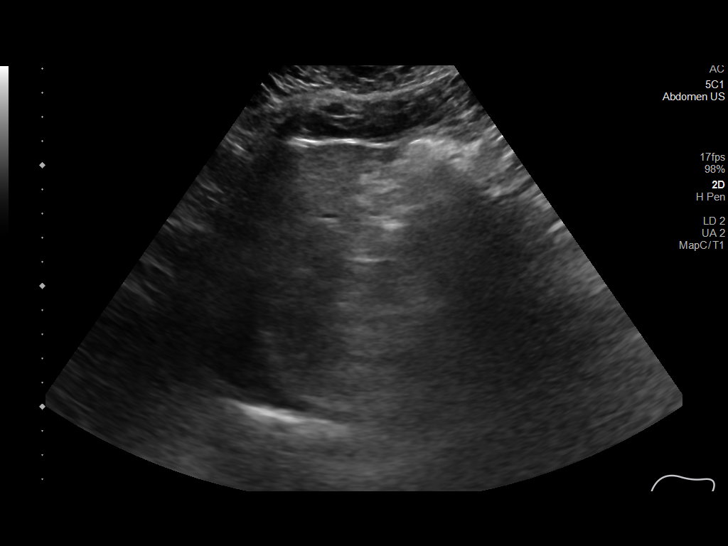
[im 41/97]
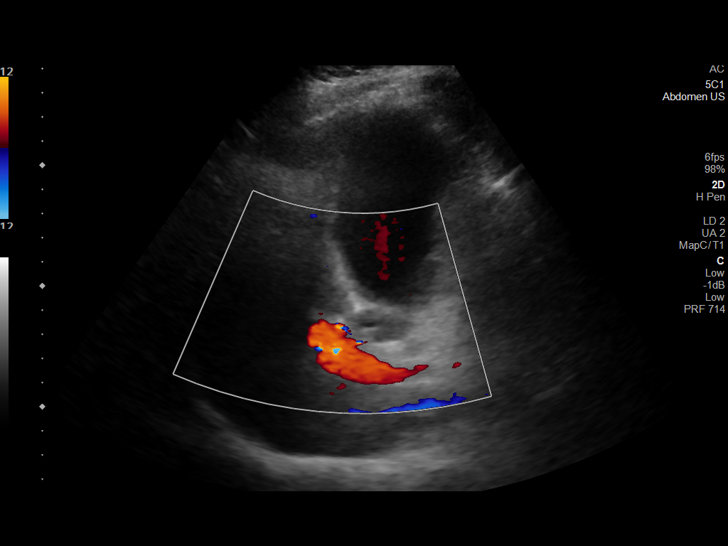
[im 49/97]
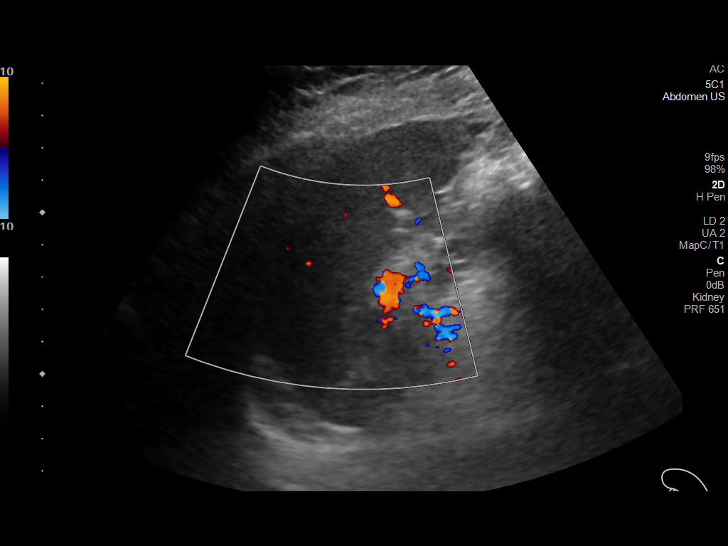
[im 57/97]
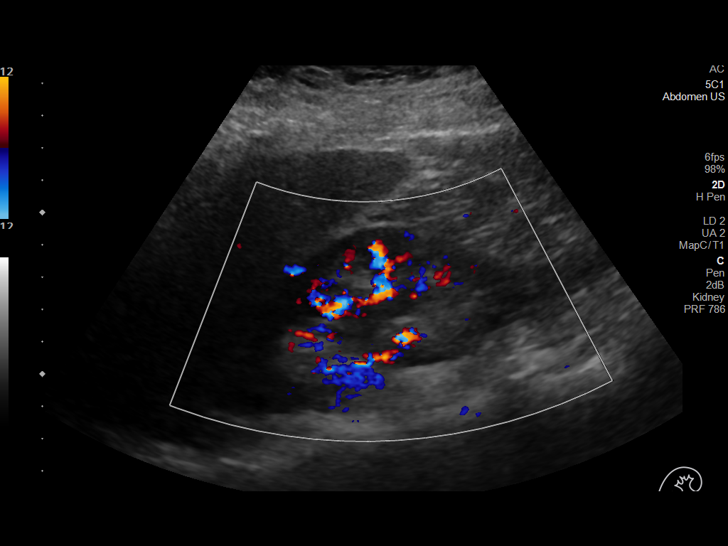
[im 65/97]
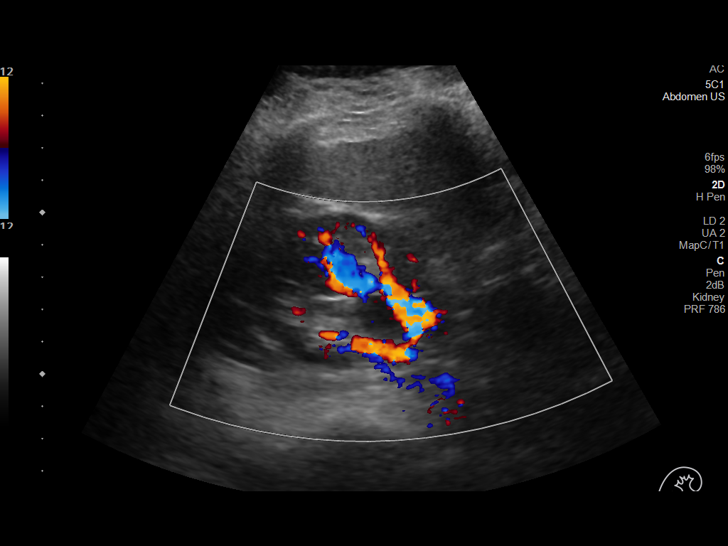
[im 73/97]
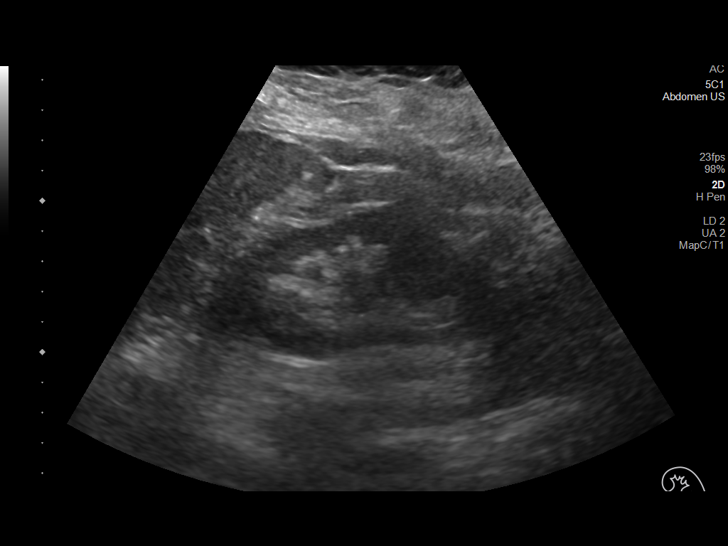
[im 81/97]
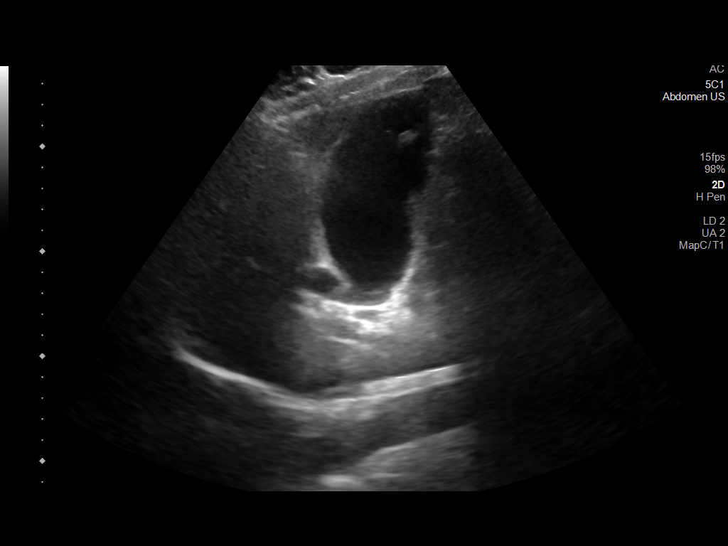
[im 89/97]
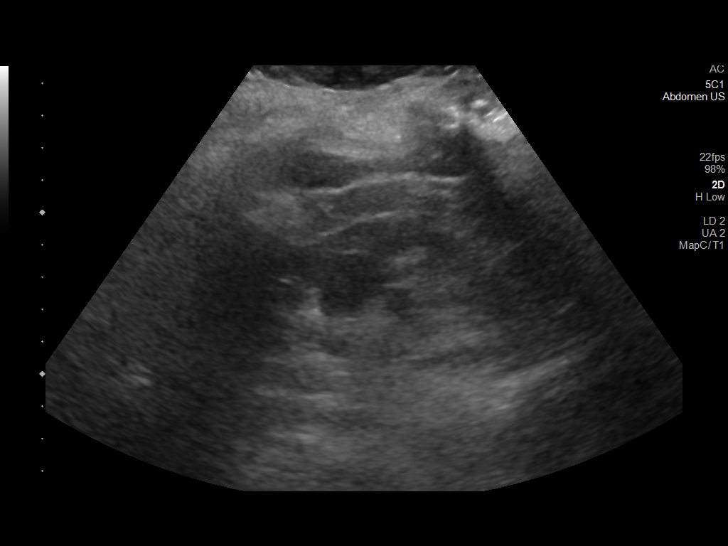
[im 97/97]
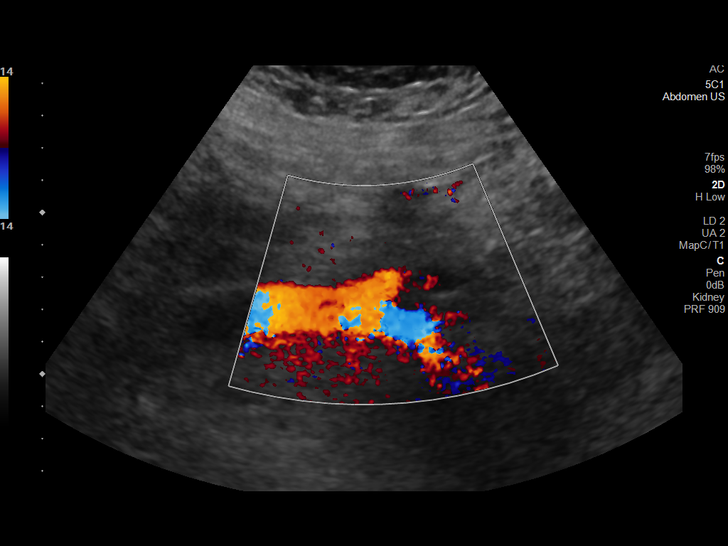

[13 of 25 positions shown; findings below may reference images not displayed]

FINDINGS: Gallbladder: Cholelithiasis. The gallbladder is slightly distended
measuring up to 9.5 x 4.7 cm. Borderline wall thickening measuring
3-4 mm. No pericholecystic fluid. Negative sonographic Murphy's
sign.

Common bile duct: Diameter: 0.5 cm

Liver: No focal lesion identified. Within normal limits in
parenchymal echogenicity. Portal vein is patent on color Doppler
imaging with normal direction of blood flow towards the liver.

IVC: Not well visualized.

Pancreas: Visualized portion unremarkable.

Spleen: Size and appearance within normal limits.

Right Kidney: Length: 10.1 cm. Echogenicity within normal limits. No
mass or hydronephrosis visualized.

Left Kidney: Length: 10.7 cm. There is an ill-defined mass in the
midpole measuring up to 2.4 x 1.8 x 1.7 cm.

Abdominal aorta: No aneurysm visualized.

Other findings: None.
IMPRESSION: 1. Cholelithiasis with distended gallbladder and borderline wall
thickening. No pericholecystic fluid, negative sonographic Murphy's
sign. Overall, these findings are equivocal for acute cholecystitis.
If persistent clinical concern, recommend HIDA scan for further
evaluation.
2. There is an ill-defined mass in the left kidney midpole measuring
up to 2.4 cm. Further evaluation with either CT or MRI renal mass
protocol is recommended.
3. The appendix is not visualized on this exam.

## 2023-09-14 ENCOUNTER — Other Ambulatory Visit: Payer: Self-pay | Admitting: Medical Genetics

## 2023-09-21 ENCOUNTER — Other Ambulatory Visit (HOSPITAL_COMMUNITY)
Admission: RE | Admit: 2023-09-21 | Discharge: 2023-09-21 | Disposition: A | Discharge status: Home / Self Care | Payer: Self-pay | Source: Ambulatory Visit | Attending: Oncology | Admitting: Oncology

## 2023-10-02 LAB — GENECONNECT MOLECULAR SCREEN: Genetic Analysis Overall Interpretation: NEGATIVE
